# Patient Record
Sex: Female | Born: 1955 | Race: White | Hispanic: No | Marital: Married | Smoking: Never smoker
Health system: Southern US, Community
[De-identification: ages and names within clinical notes are randomized; demographics above are authoritative.]

## PROBLEM LIST (undated history)

## (undated) DIAGNOSIS — F419 Anxiety disorder, unspecified: Secondary | ICD-10-CM

## (undated) DIAGNOSIS — F32A Depression, unspecified: Secondary | ICD-10-CM

## (undated) DIAGNOSIS — F319 Bipolar disorder, unspecified: Secondary | ICD-10-CM

## (undated) DIAGNOSIS — F329 Major depressive disorder, single episode, unspecified: Secondary | ICD-10-CM

## (undated) HISTORY — DX: Depression, unspecified: F32.A

## (undated) HISTORY — PX: CHOLECYSTECTOMY: SHX55

## (undated) HISTORY — DX: Bipolar disorder, unspecified: F31.9

## (undated) HISTORY — PX: TONSILLECTOMY: SUR1361

## (undated) HISTORY — DX: Anxiety disorder, unspecified: F41.9

## (undated) HISTORY — DX: Major depressive disorder, single episode, unspecified: F32.9

## (undated) HISTORY — PX: OTHER SURGICAL HISTORY: SHX169

---

## 1998-07-07 ENCOUNTER — Encounter: Payer: Self-pay | Admitting: Obstetrics and Gynecology

## 1998-07-07 ENCOUNTER — Ambulatory Visit (HOSPITAL_COMMUNITY): Admission: RE | Admit: 1998-07-07 | Discharge: 1998-07-07 | Payer: Self-pay | Admitting: Obstetrics and Gynecology

## 1998-12-03 ENCOUNTER — Inpatient Hospital Stay (HOSPITAL_COMMUNITY): Admission: AD | Admit: 1998-12-03 | Discharge: 1998-12-06 | Payer: Self-pay | Admitting: Obstetrics and Gynecology

## 1998-12-07 ENCOUNTER — Encounter (HOSPITAL_COMMUNITY): Admission: RE | Admit: 1998-12-07 | Discharge: 1999-03-07 | Payer: Self-pay | Admitting: Obstetrics and Gynecology

## 1999-08-08 ENCOUNTER — Other Ambulatory Visit: Admission: RE | Admit: 1999-08-08 | Discharge: 1999-08-08 | Payer: Self-pay | Admitting: Obstetrics and Gynecology

## 1999-11-29 ENCOUNTER — Encounter: Admission: RE | Admit: 1999-11-29 | Discharge: 1999-11-29 | Payer: Self-pay | Admitting: Obstetrics and Gynecology

## 1999-11-29 ENCOUNTER — Encounter: Payer: Self-pay | Admitting: Obstetrics and Gynecology

## 2000-11-13 ENCOUNTER — Other Ambulatory Visit: Admission: RE | Admit: 2000-11-13 | Discharge: 2000-11-13 | Payer: Self-pay | Admitting: Obstetrics and Gynecology

## 2001-11-18 ENCOUNTER — Other Ambulatory Visit: Admission: RE | Admit: 2001-11-18 | Discharge: 2001-11-18 | Payer: Self-pay | Admitting: Obstetrics and Gynecology

## 2002-03-16 ENCOUNTER — Ambulatory Visit (HOSPITAL_COMMUNITY): Admission: RE | Admit: 2002-03-16 | Discharge: 2002-03-16 | Payer: Self-pay | Admitting: Obstetrics and Gynecology

## 2002-03-16 ENCOUNTER — Encounter: Payer: Self-pay | Admitting: Obstetrics and Gynecology

## 2003-01-22 ENCOUNTER — Other Ambulatory Visit: Admission: RE | Admit: 2003-01-22 | Discharge: 2003-01-22 | Payer: Self-pay | Admitting: Obstetrics and Gynecology

## 2003-10-16 ENCOUNTER — Emergency Department (HOSPITAL_COMMUNITY): Admission: EM | Admit: 2003-10-16 | Discharge: 2003-10-17 | Payer: Self-pay | Admitting: Emergency Medicine

## 2004-01-25 ENCOUNTER — Other Ambulatory Visit: Admission: RE | Admit: 2004-01-25 | Discharge: 2004-01-25 | Payer: Self-pay | Admitting: Obstetrics and Gynecology

## 2007-12-08 ENCOUNTER — Ambulatory Visit (HOSPITAL_COMMUNITY): Admission: RE | Admit: 2007-12-08 | Discharge: 2007-12-08 | Payer: Self-pay | Admitting: Obstetrics and Gynecology

## 2010-09-19 NOTE — Op Note (Signed)
NAMELINDEN, Jasmine Schmidt               ACCOUNT NO.:  [id number]   MEDICAL RECORD NO.:  [id number]          PATIENT TYPE:  AMB   LOCATION:  SDC                           FACILITY:  [facility]   PHYSICIAN:  Zenaida Niece, M.D.DATE OF BIRTH:  Jan 17, 1956   DATE OF PROCEDURE:  12/08/2007  DATE OF DISCHARGE:                               OPERATIVE REPORT   PREOPERATIVE DIAGNOSIS:  Stress urinary incontinence.   POSTOPERATIVE DIAGNOSIS:  Stress urinary incontinence.   PROCEDURES:  Solyx sling and cystoscopy.   SURGEON:  Zenaida Niece, MD   ANESTHESIA:  General with an LMA and local block.   FINDINGS:  She had a normal bladder and urethra via cystoscopy.   SPECIMENS:  None.   ESTIMATED BLOOD LOSS:  100 mL.   COMPLICATIONS:  None.   PROCEDURE IN DETAIL:  The patient was taken to the operating room and  placed in the dorsal supine position.  General anesthesia was induced  and she was placed in mobile stirrups.  Legs were then elevated in the  stirrups.  Perineum and vagina were then prepped and draped in the usual  sterile fashion and the bladder drained with a latex free catheter.  A  Deaver retractor was used just to retract the posterior vagina.  The  anterior vagina was grasped with Allis clamps at a distance  approximately a little further than one fingerbreadth from the urethral  meatus.  Local anesthetic with 1% lidocaine mixed with 0.5% Marcaine  with epinephrine was injected here and laterally for hydrodissection.  A  1-cm vertical incision was made.  Metzenbaum scissors were then used to  dissect out laterally to the pubic ramus on both sides.  The Solyx sling  was then easily inserted first on the patient's right side leaving the  middle of the sling in the middle of the urethra.  The introducer was  removed and placed on the left side, then left side was introduced.  Both sides were introduced past the pubic ramus into the obturator  muscle.  A right-angle clamp was passed  between the urethral tissue and  the sling.  At first, it appeared to be a little loose and so the sling  was pushed in a little bit further on the patient's left side.  Cystoscopy was then performed with a 70-degree cystoscope using sterile  water.  200 mL was instilled.  The bladder appeared normal and both  ureteral orifices appeared normal.  The urethra appeared normal.  The  cystoscope was removed.  With the sling in its current position, Cred  maneuver did not express any urine.  The introducer was removed on the  patient's left side.  The incision was closed with 2-0 Vicryl in a  running locking fashion.  The bladder was then drained of approximately  one-half of the fluid that was instilled.  No significant bleeding was  noted.  All instruments were removed from the  vagina.  The patient was taken down from stirrups.  She was awakened in  the operating room and taken to the recovery room in stable condition.  Counts were correct.  She received Ancef 1 g IV prior to the procedure  and had PAS hose on throughout the procedure.      Zenaida Niece, M.D.  Electronically Signed     TDM/MEDQ  D:  12/08/2007  T:  12/08/2007  Job:  [id number]

## 2011-02-02 LAB — CBC
HCT: 37.2
Hemoglobin: 12.9
MCHC: 34.7
MCV: 88.3
Platelets: 235
RBC: 4.21
RDW: 12.6
WBC: 5.4

## 2011-02-02 LAB — PREGNANCY, URINE: Preg Test, Ur: NEGATIVE

## 2014-07-13 ENCOUNTER — Ambulatory Visit (INDEPENDENT_AMBULATORY_CARE_PROVIDER_SITE_OTHER): Payer: Self-pay

## 2014-07-13 ENCOUNTER — Ambulatory Visit (INDEPENDENT_AMBULATORY_CARE_PROVIDER_SITE_OTHER): Payer: Self-pay | Admitting: Family Medicine

## 2014-07-13 VITALS — BP 120/84 | HR 106 | Temp 97.8°F | Resp 20 | Ht 67.25 in | Wt 215.4 lb

## 2014-07-13 DIAGNOSIS — R110 Nausea: Secondary | ICD-10-CM

## 2014-07-13 DIAGNOSIS — R42 Dizziness and giddiness: Secondary | ICD-10-CM

## 2014-07-13 DIAGNOSIS — A499 Bacterial infection, unspecified: Secondary | ICD-10-CM

## 2014-07-13 DIAGNOSIS — R232 Flushing: Secondary | ICD-10-CM

## 2014-07-13 DIAGNOSIS — N951 Menopausal and female climacteric states: Secondary | ICD-10-CM

## 2014-07-13 DIAGNOSIS — R319 Hematuria, unspecified: Secondary | ICD-10-CM

## 2014-07-13 DIAGNOSIS — Z79899 Other long term (current) drug therapy: Secondary | ICD-10-CM

## 2014-07-13 DIAGNOSIS — N390 Urinary tract infection, site not specified: Secondary | ICD-10-CM

## 2014-07-13 LAB — POCT UA - MICROSCOPIC ONLY
Bacteria, U Microscopic: NEGATIVE
Casts, Ur, LPF, POC: NEGATIVE
Crystals, Ur, HPF, POC: NEGATIVE
Mucus, UA: NEGATIVE
Yeast, UA: NEGATIVE

## 2014-07-13 LAB — POCT CBC
Granulocyte percent: 60.7 % (ref 37–80)
HCT, POC: 49.8 % — AB (ref 37.7–47.9)
Hemoglobin: 16.8 g/dL — AB (ref 12.2–16.2)
Lymph, poc: 2.7 (ref 0.6–3.4)
MCH, POC: 28.8 pg (ref 27–31.2)
MCHC: 33.7 g/dL (ref 31.8–35.4)
MCV: 85.4 fL (ref 80–97)
MID (cbc): 0.4 (ref 0–0.9)
MPV: 6.8 fL (ref 0–99.8)
POC Granulocyte: 4.9 (ref 2–6.9)
POC LYMPH PERCENT: 34.1 %L (ref 10–50)
POC MID %: 5.2 %M (ref 0–12)
Platelet Count, POC: 302 10*3/uL (ref 142–424)
RBC: 5.83 M/uL — AB (ref 4.04–5.48)
RDW, POC: 12.5 %
WBC: 8 10*3/uL (ref 4.6–10.2)

## 2014-07-13 LAB — POCT URINALYSIS DIPSTICK
Glucose, UA: NEGATIVE
Nitrite, UA: NEGATIVE
Protein, UA: 30
Spec Grav, UA: >=1.03
Urobilinogen, UA: 1
pH, UA: 5.5

## 2014-07-13 MED ORDER — CEPHALEXIN 500 MG PO CAPS: 500.0000 mg | ORAL_CAPSULE | Freq: Two times a day (BID) | ORAL | Status: DC

## 2014-07-13 MED ORDER — PROMETHAZINE HCL 25 MG PO TABS: 25.0000 mg | ORAL_TABLET | Freq: Three times a day (TID) | ORAL | Status: DC | PRN

## 2014-07-13 NOTE — Progress Notes (Signed)
Chief Complaint:  Chief Complaint  Patient presents with  . Dizziness    started on thursday night.  nausea with the dizziness.  feels very weak and lethargic.  diarrhea started today. chills     HPI: Jasmine Schmidt is a 59 y.o. female who is here for  Flu like sxs, which started on Thursday night. She also has associated nausea with disease. She also has had chills and diarrhea. Feels really spinning. To really eat or drink. Continues ginger ale. She also had some light and sound sensitivity. Now that has resolved. Riding scooter. She had hot flashes felt nauseated. She got a little bit better. Is on Vyvanse, Cymbalta and Lamictal. She got off of the Cymbalta on March 1. She started having symptoms about 3 days later. She thought she had enough Cymbalta last her for another month. However she did not. She asked the nurse practitioner closer at the Cymbalta to fill out the form for patient assistance that she cannot afford it. She has no insurance. Is not received word whether she has a new prescription at her office. Since she's been feeling so terrible she has been also offer Lamictal and Vyvanse. She has had nonbloody diarrhea. She has no urinary symptoms. She does not know what dose she is on but she takes it twice a day. She has not had a menstrual cycle since 2013. He has some hot flashes associated with being postmenopausal.  Past Medical History  Diagnosis Date  . Anxiety   . Depression   . Bipolar 1 disorder    Past Surgical History  Procedure Laterality Date  . Tonsillectomy    . Pelvic sling     History   Social History  . Marital Status: Married    Spouse Name: N/A  . Number of Children: N/A  . Years of Education: N/A   Social History Main Topics  . Smoking status: Never Smoker   . Smokeless tobacco: Not on file  . Alcohol Use: 0.0 oz/week    0 Standard drinks or equivalent per week     Comment: social use  . Drug Use: No  . Sexual Activity: Not on file    Other Topics Concern  . None   Social History Narrative  . None   Family History  Problem Relation Age of Onset  . Diabetes Mother   . Diabetes Father   . Heart disease Father   . Hypertension Father   . Stroke Father    No Known Allergies Prior to Admission medications   Not on File     ROS: The patient denies fevers, , night sweats, unintentional weight loss, chest pain, palpitations, wheezing, dyspnea on exertion, vomiting, abdominal pain, dysuria, hematuria, melena, numbness,  or tingling.   All other systems have been reviewed and were otherwise negative with the exception of those mentioned in the HPI and as above.    PHYSICAL EXAM: Filed Vitals:   07/13/14 1919  BP: 120/84  Pulse: 106  Temp: 97.8 F (36.6 C)  Resp: 20   Filed Vitals:   07/13/14 1919  Height: 5' 7.25" (1.708 m)  Weight: 215 lb 6 oz (97.693 kg)   Body mass index is 33.49 kg/(m^2).  General: Alert, no acute distress HEENT:  Normocephalic, atraumatic, oropharynx patent. EOMI, PERRLA , fundo exam normal. Cardiovascular:  Regular rate and rhythm, no rubs murmurs or gallops.  No Carotid bruits, radial pulse intact. No pedal edema.  Respiratory: Clear to auscultation  bilaterally.  No wheezes, rales, or rhonchi.  No cyanosis, no use of accessory musculature GI: No organomegaly, abdomen is soft and non-tender, positive bowel sounds.  No masses. Skin: No rashes. She is slightly flushed. Neurologic: Facial musculature symmetric. Cranial nerves II-12 grossly normal. Psychiatric: Patient is appropriate throughout our interaction. Lymphatic: No cervical lymphadenopathy Musculoskeletal: Gait intact. 5 out of 5 strength.   LABS: Results for orders placed or performed in visit on 07/13/14  POCT CBC  Result Value Ref Range   WBC 8.0 4.6 - 10.2 K/uL   Lymph, poc 2.7 0.6 - 3.4   POC LYMPH PERCENT 34.1 10 - 50 %L   MID (cbc) 0.4 0 - 0.9   POC MID % 5.2 0 - 12 %M   POC Granulocyte 4.9 2 - 6.9    Granulocyte percent 60.7 37 - 80 %G   RBC 5.83 (A) 4.04 - 5.48 M/uL   Hemoglobin 16.8 (A) 12.2 - 16.2 g/dL   HCT, POC 16.1 (A) 09.6 - 47.9 %   MCV 85.4 80 - 97 fL   MCH, POC 28.8 27 - 31.2 pg   MCHC 33.7 31.8 - 35.4 g/dL   RDW, POC 04.5 %   Platelet Count, POC 302 142 - 424 K/uL   MPV 6.8 0 - 99.8 fL  POCT UA - Microscopic Only  Result Value Ref Range   WBC, Ur, HPF, POC 2-3    RBC, urine, microscopic 4-8    Bacteria, U Microscopic neg    Mucus, UA neg    Epithelial cells, urine per micros 0-2    Crystals, Ur, HPF, POC neg    Casts, Ur, LPF, POC neg    Yeast, UA neg    Renal tubular cells 0-2   POCT urinalysis dipstick  Result Value Ref Range   Color, UA yellow    Clarity, UA clear    Glucose, UA neg    Bilirubin, UA small    Ketones, UA trace    Spec Grav, UA >=1.030    Blood, UA large    pH, UA 5.5    Protein, UA 30    Urobilinogen, UA 1.0    Nitrite, UA neg    Leukocytes, UA Trace      EKG/XRAY:   Primary read interpreted by Dr. [facility] at [facility]. Neg for obstruction   ASSESSMENT/PLAN: Encounter Diagnoses  Name Primary?  . Nausea without vomiting Yes  . Hot flashes   . Dizziness and giddiness   . Medication course changed   . UTI (urinary tract infection), bacterial   . Hematuria    Jasmine Schmidt is a pleasant 59 year old female with a past medical history bipolar disorder, anxiety and depression who is here symptoms of Cymbalta withdrawal.  She may also have a UTI. She ran out of her Cymbalta about 7 days ago and started having symptoms about 3 days later. I have asked her to call the nurse practitioner prescribes her Cymbalta to see if her free samples have arrived, unfortunately she cannot afford Cymbalta out-of-pocket. We do not have any samples available for her here. She states that she cannot afford Cymbalta even if she uses a discount card. I have given her significant withdrawal symptoms and side effects from abruptly stopping Cymbalta. Prescribed  promethazine and also Keflex for UTI. We will not Culture her urine due to cost. CMP, TSH pending. CBC was fairly unremarkable. Follow-up as needed.  Gross sideeffects, risk and benefits, and alternatives of medications d/w patient. Patient is  aware that all medications have potential sideeffects and we are unable to predict every sideeffect or drug-drug interaction that may occur.  Hamilton Capri PHUONG, DO 07/13/2014 8:55 PM

## 2014-07-13 NOTE — Patient Instructions (Signed)
Duloxetine delayed-release capsules What is this medicine? DULOXETINE (doo LOX e teen) is used to treat depression, anxiety, and different types of chronic pain. This medicine may be used for other purposes; ask your health care provider or pharmacist if you have questions. COMMON BRAND NAME(S): Cymbalta What should I tell my health care provider before I take this medicine? They need to know if you have any of these conditions: -bipolar disorder or a family history of bipolar disorder -glaucoma -kidney disease -liver disease -suicidal thoughts or a previous suicide attempt -taken medicines called MAOIs like Carbex, Eldepryl, Marplan, Nardil, and Parnate within 14 days -an unusual reaction to duloxetine, other medicines, foods, dyes, or preservatives -pregnant or trying to get pregnant -breast-feeding How should I use this medicine? Take this medicine by mouth with a glass of water. Follow the directions on the prescription label. Do not cut, crush or chew this medicine. You can take this medicine with or without food. Take your medicine at regular intervals. Do not take your medicine more often than directed. Do not stop taking this medicine suddenly except upon the advice of your doctor. Stopping this medicine too quickly may cause serious side effects or your condition may worsen. A special MedGuide will be given to you by the pharmacist with each prescription and refill. Be sure to read this information carefully each time. Talk to your pediatrician regarding the use of this medicine in children. While this drug may be prescribed for children as Limb as 7 years of age for selected conditions, precautions do apply. Overdosage: If you think you have taken too much of this medicine contact a poison control center or emergency room at once. NOTE: This medicine is only for you. Do not share this medicine with others. What if I miss a dose? If you miss a dose, take it as soon as you can. If it  is almost time for your next dose, take only that dose. Do not take double or extra doses. What may interact with this medicine? Do not take this medicine with any of the following medications: -certain diet drugs like dexfenfluramine, fenfluramine -desvenlafaxine -linezolid -MAOIs like Azilect, Carbex, Eldepryl, Marplan, Nardil, and Parnate -methylene blue (intravenous) -milnacipran -thioridazine -venlafaxine This medicine may also interact with the following medications: -alcohol -aspirin and aspirin-like medicines -certain antibiotics like ciprofloxacin and enoxacin -certain medicines for blood pressure, heart disease, irregular heart beat -certain medicines for depression, anxiety, or psychotic disturbances -certain medicines for migraine headache like almotriptan, eletriptan, frovatriptan, naratriptan, rizatriptan, sumatriptan, zolmitriptan -certain medicines that treat or prevent blood clots like warfarin, enoxaparin, and dalteparin -cimetidine -fentanyl -lithium -NSAIDS, medicines for pain and inflammation, like ibuprofen or naproxen -phentermine -procarbazine -sibutramine -St. John's wort -theophylline -tramadol -tryptophan This list may not describe all possible interactions. Give your health care provider a list of all the medicines, herbs, non-prescription drugs, or dietary supplements you use. Also tell them if you smoke, drink alcohol, or use illegal drugs. Some items may interact with your medicine. What should I watch for while using this medicine? Tell your doctor if your symptoms do not get better or if they get worse. Visit your doctor or health care professional for regular checks on your progress. Because it may take several weeks to see the full effects of this medicine, it is important to continue your treatment as prescribed by your doctor. Patients and their families should watch out for new or worsening thoughts of suicide or depression. Also watch out for  sudden changes in   feelings such as feeling anxious, agitated, panicky, irritable, hostile, aggressive, impulsive, severely restless, overly excited and hyperactive, or not being able to sleep. If this happens, especially at the beginning of treatment or after a change in dose, call your health care professional. You may get drowsy or dizzy. Do not drive, use machinery, or do anything that needs mental alertness until you know how this medicine affects you. Do not stand or sit up quickly, especially if you are an older patient. This reduces the risk of dizzy or fainting spells. Alcohol may interfere with the effect of this medicine. Avoid alcoholic drinks. This medicine can cause an increase in blood pressure. This medicine can also cause a sudden drop in your blood pressure, which may make you feel faint and increase the chance of a fall. These effects are most common when you first start the medicine or when the dose is increased, or during use of other medicines that can cause a sudden drop in blood pressure. Check with your doctor for instructions on monitoring your blood pressure while taking this medicine. Your mouth may get dry. Chewing sugarless gum or sucking hard candy, and drinking plenty of water may help. Contact your doctor if the problem does not go away or is severe. What side effects may I notice from receiving this medicine? Side effects that you should report to your doctor or health care professional as soon as possible: -allergic reactions like skin rash, itching or hives, swelling of the face, lips, or tongue -changes in blood pressure -confusion -dark urine -dizziness -fast talking and excited feelings or actions that are out of control -fast, irregular heartbeat -fever -general ill feeling or flu-like symptoms -hallucination, loss of contact with reality -light-colored stools -loss of balance or coordination -redness, blistering, peeling or loosening of the skin, including  inside the mouth -right upper belly pain -seizures -suicidal thoughts or other mood changes -trouble concentrating -trouble passing urine or change in the amount of urine -unusual bleeding or bruising -unusually weak or tired -yellowing of the eyes or skin Side effects that usually do not require medical attention (report to your doctor or health care professional if they continue or are bothersome): -blurred vision -change in appetite -change in sex drive or performance -headache -increased sweating -nausea This list may not describe all possible side effects. Call your doctor for medical advice about side effects. You may report side effects to FDA at 1-800-FDA-1088. Where should I keep my medicine? Keep out of the reach of children. Store at room temperature between 15 and 30 degrees C (59 and 86 degrees F). Throw away any unused medicine after the expiration date. NOTE: This sheet is a summary. It may not cover all possible information. If you have questions about this medicine, talk to your doctor, pharmacist, or health care provider.  2015, [employer]vier/[employer] Standard. (2013-04-14 14:20:31)  

## 2014-07-14 ENCOUNTER — Telehealth: Payer: Self-pay | Admitting: Family Medicine

## 2014-07-14 LAB — COMPLETE METABOLIC PANEL WITH GFR
ALT: 26 U/L (ref 0–35)
AST: 20 U/L (ref 0–37)
Albumin: 4.8 g/dL (ref 3.5–5.2)
Alkaline Phosphatase: 139 U/L — ABNORMAL HIGH (ref 39–117)
BUN: 13 mg/dL (ref 6–23)
CO2: 26 mEq/L (ref 19–32)
Chloride: 101 mEq/L (ref 96–112)
Creat: 0.86 mg/dL (ref 0.50–1.10)
GFR, Est Non African American: 76 mL/min
Sodium: 141 mEq/L (ref 135–145)
Total Bilirubin: 0.6 mg/dL (ref 0.2–1.2)

## 2014-07-14 LAB — COMPLETE METABOLIC PANEL WITHOUT GFR
Calcium: 10.7 mg/dL — ABNORMAL HIGH (ref 8.4–10.5)
GFR, Est African American: 87 mL/min
Glucose, Bld: 113 mg/dL — ABNORMAL HIGH (ref 70–99)
Potassium: 4.3 meq/L (ref 3.5–5.3)
Total Protein: 8.5 g/dL — ABNORMAL HIGH (ref 6.0–8.3)

## 2014-07-14 LAB — TSH: TSH: 3.305 u[IU]/mL (ref 0.350–4.500)

## 2014-07-14 NOTE — Telephone Encounter (Signed)
Spoke to patient about labs, needs to monitor for worsening sxs, she is in the midst of getting a refill for Cymbalta through [facility]

## 2014-07-21 ENCOUNTER — Ambulatory Visit (INDEPENDENT_AMBULATORY_CARE_PROVIDER_SITE_OTHER): Payer: Self-pay | Admitting: Family Medicine

## 2014-07-21 ENCOUNTER — Encounter: Payer: Self-pay | Admitting: Family Medicine

## 2014-07-21 VITALS — BP 132/84 | HR 102 | Resp 24

## 2014-07-21 DIAGNOSIS — T887XXA Unspecified adverse effect of drug or medicament, initial encounter: Secondary | ICD-10-CM

## 2014-07-21 DIAGNOSIS — F3162 Bipolar disorder, current episode mixed, moderate: Secondary | ICD-10-CM

## 2014-07-21 DIAGNOSIS — F410 Panic disorder [episodic paroxysmal anxiety] without agoraphobia: Secondary | ICD-10-CM | POA: Insufficient documentation

## 2014-07-21 DIAGNOSIS — T50905A Adverse effect of unspecified drugs, medicaments and biological substances, initial encounter: Secondary | ICD-10-CM

## 2014-07-21 DIAGNOSIS — R079 Chest pain, unspecified: Secondary | ICD-10-CM

## 2014-07-21 DIAGNOSIS — F411 Generalized anxiety disorder: Secondary | ICD-10-CM

## 2014-07-21 MED ORDER — ALPRAZOLAM 0.25 MG PO TABS
ORAL_TABLET | ORAL | Status: AC
Start: 2014-07-21 — End: ?

## 2014-07-21 MED ORDER — ALPRAZOLAM 0.25 MG PO TABS
0.2500 mg | ORAL_TABLET | Freq: Once | ORAL | Status: AC
  Administered 2014-07-21: 0.25 mg via ORAL

## 2014-07-21 NOTE — Progress Notes (Signed)
59 year old lady who has been having some medicines adjusted. She had run out of her Cymbalta, and is been trying to get some more. Dr. Conley RollsLe had advised her to resume her previous medicines which included the Lamictal. She had seen Dr. Nedra HaiLee last week with withdrawal symptoms from the Cymbalta. She decided to take the Lamictal today, and started back in on the 40 mg that she used to be on. Within a couple of hours she developed and society with shaking all over and feeling like her heart was racing and pounding. She came in here to be seen. A friend drove her here.  She has a long history of psychiatric issues, bipolar since she was [place]. She does not have money and does not have her regular psychiatrist. [facility]. However she has had some difficulties with the way some things of been handled also, and does not want to go back there. She prefers the way Dr. Conley RollsLe is handling her problems.  She is not having chest pain or tightness or shortness of breath. She was felt to emergency her chest which is calm down now. She just been nervous all over. She knows that she had a little bit of a panic attack type sensation.  Objective: Extremely anxious and tremulous. Fully alert and oriented. Throat clear. Neck supple without nodes.Chest is clear. Heart regular without murmurs gallops or arrhythmias. Mildly tachycardic. Abdomen soft without mass or tenderness. Cranial nerves II-12 grossly intact. Motor symmetrical. Rapid alternating movements normal. Finger to nose normal. Gait good except for her shakiness.  EKG normal  Assessment: Panic Tachycardia Extreme anxiety Probable adverse effect of starting back in on her previously regular dose of Lamictal Bipolar  Plan: Low back on the medical starting tomorrow, but go very slowly with it. Take only one half of a 200 mg pill daily initially, then graduated upward. See Dr. Nedra HaiLee back next  week  Given 0.25 of Xanax here in the office and a prescription for some more visit for short-term use only.

## 2014-07-21 NOTE — Patient Instructions (Signed)
Take the alprazolam one every 8-12 hours if needed for anxiety  If you have a panic attack try breathing in and out slowly of a paper bag.  Try to get a little exercise such as taking a walk to help relax you  Take the Lamictal only one half tablet daily for about 5 days, then one half tablet twice daily. Do not go above the 200 mg until you're seen Dr. Conley RollsLe back. Please see Dr. Conley RollsLe next week. I do recommend that you still try to find a psychiatrist that you can be seeing.

## 2016-12-03 IMAGING — CR DG ABDOMEN 1V
1 series · 1 of 1 positions shown · non-contrast
Comparison: None.

CLINICAL DATA: Nausea without vomiting. Epigastric pain. Diarrhea.

EXAM:
ABDOMEN - 1 VIEW

[AP]
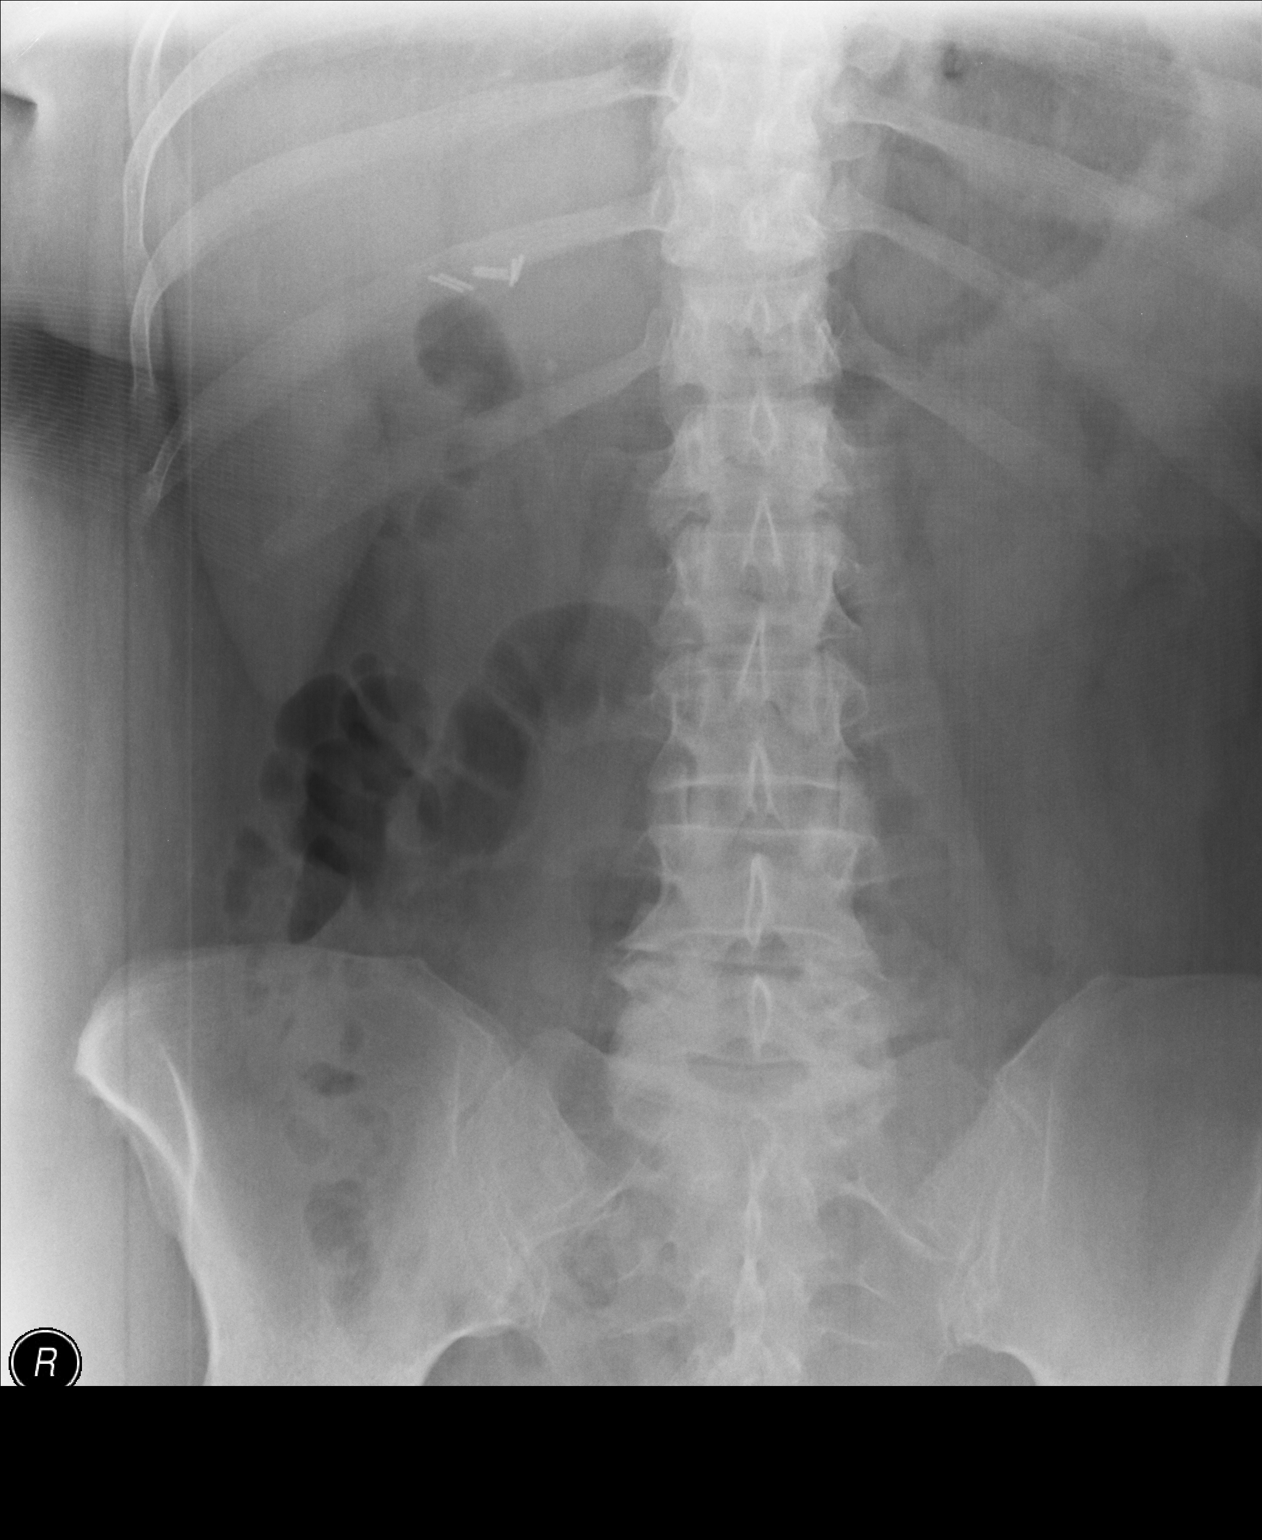

[1 of 1 positions shown; findings below may reference images not displayed]

FINDINGS: The bowel gas pattern is normal. No radio-opaque calculi or other
significant radiographic abnormality are seen.
IMPRESSION: Negative.

## 2022-07-17 ENCOUNTER — Emergency Department (HOSPITAL_BASED_OUTPATIENT_CLINIC_OR_DEPARTMENT_OTHER)
Admission: EM | Admit: 2022-07-17 | Discharge: 2022-07-17 | Disposition: A | Discharge status: Home / Self Care | Payer: Medicaid Other | Attending: Emergency Medicine | Admitting: Emergency Medicine

## 2022-07-17 ENCOUNTER — Encounter (HOSPITAL_BASED_OUTPATIENT_CLINIC_OR_DEPARTMENT_OTHER): Payer: Self-pay

## 2022-07-17 ENCOUNTER — Other Ambulatory Visit: Payer: Self-pay

## 2022-07-17 ENCOUNTER — Emergency Department (HOSPITAL_BASED_OUTPATIENT_CLINIC_OR_DEPARTMENT_OTHER): Payer: Medicaid Other | Admitting: Radiology

## 2022-07-17 DIAGNOSIS — Z1152 Encounter for screening for COVID-19: Secondary | ICD-10-CM | POA: Insufficient documentation

## 2022-07-17 DIAGNOSIS — J101 Influenza due to other identified influenza virus with other respiratory manifestations: Secondary | ICD-10-CM | POA: Diagnosis not present

## 2022-07-17 DIAGNOSIS — R197 Diarrhea, unspecified: Secondary | ICD-10-CM | POA: Diagnosis present

## 2022-07-17 DIAGNOSIS — J111 Influenza due to unidentified influenza virus with other respiratory manifestations: Secondary | ICD-10-CM

## 2022-07-17 LAB — COMPREHENSIVE METABOLIC PANEL
ALT: 33 U/L (ref 0–44)
AST: 25 U/L (ref 15–41)
Albumin: 4.8 g/dL (ref 3.5–5.0)
Alkaline Phosphatase: 97 U/L (ref 38–126)
Anion gap: 12 (ref 5–15)
BUN: 11 mg/dL (ref 8–23)
CO2: 27 mmol/L (ref 22–32)
Calcium: 10.1 mg/dL (ref 8.9–10.3)
Chloride: 101 mmol/L (ref 98–111)
Creatinine, Ser: 0.73 mg/dL (ref 0.44–1.00)
GFR, Estimated: >60 mL/min (ref >60)
Glucose, Bld: 114 mg/dL — ABNORMAL HIGH (ref 70–99)
Potassium: 3.2 mmol/L — ABNORMAL LOW (ref 3.5–5.1)
Sodium: 140 mmol/L (ref 135–145)
Total Bilirubin: 0.7 mg/dL (ref 0.3–1.2)
Total Protein: 8.3 g/dL — ABNORMAL HIGH (ref 6.5–8.1)

## 2022-07-17 LAB — GROUP A STREP BY PCR: Group A Strep by PCR: NOT DETECTED

## 2022-07-17 LAB — CBC
HCT: 46.2 % — ABNORMAL HIGH (ref 36.0–46.0)
Hemoglobin: 16 g/dL — ABNORMAL HIGH (ref 12.0–15.0)
MCH: 29.1 pg (ref 26.0–34.0)
MCHC: 34.6 g/dL (ref 30.0–36.0)
MCV: 84 fL (ref 80.0–100.0)
Platelets: 195 10*3/uL (ref 150–400)
RBC: 5.5 MIL/uL — ABNORMAL HIGH (ref 3.87–5.11)
RDW: 11.9 % (ref 11.5–15.5)
WBC: 3.7 10*3/uL — ABNORMAL LOW (ref 4.0–10.5)
nRBC: 0 % (ref 0.0–0.2)

## 2022-07-17 LAB — RESP PANEL BY RT-PCR (RSV, FLU A&B, COVID)  RVPGX2
Influenza A by PCR: POSITIVE — AB
Influenza B by PCR: NEGATIVE
Resp Syncytial Virus by PCR: NEGATIVE
SARS Coronavirus 2 by RT PCR: NEGATIVE

## 2022-07-17 LAB — LIPASE, BLOOD: Lipase: 57 U/L — ABNORMAL HIGH (ref 11–51)

## 2022-07-17 MED ORDER — LOPERAMIDE HCL 2 MG PO CAPS: 2.0000 mg | ORAL_CAPSULE | Freq: Four times a day (QID) | ORAL | 0 refills | Status: AC | PRN

## 2022-07-17 MED ORDER — ONDANSETRON 4 MG PO TBDP
4.0000 mg | ORAL_TABLET | Freq: Once | ORAL | Status: AC
  Administered 2022-07-17: 4 mg via ORAL
  Filled 2022-07-17: qty 1

## 2022-07-17 MED ORDER — ONDANSETRON 4 MG PO TBDP: 4.0000 mg | ORAL_TABLET | Freq: Three times a day (TID) | ORAL | 0 refills | Status: AC | PRN

## 2022-07-17 NOTE — ED Triage Notes (Signed)
Patient here POV from Home.  Endorses 8 days ago having Cough, Body Aches, Diarrhea, Nausea, Sore Throat.   No Emesis. No fever at Home.  NAD Noted during Triage. A&Ox4. GCS 15. BIB Wheelchair.

## 2022-07-17 NOTE — ED Notes (Signed)
Discharge instructions, follow up care, and prescription reviewed and explained, pt verbalized understanding and had no further questions on d/c.

## 2022-07-17 NOTE — ED Provider Notes (Signed)
[facility] Provider Note   CSN: [id number] Arrival date & time: 07/17/22  1536     History  Chief Complaint  Patient presents with   Diarrhea    Jasmine Schmidt is a 67 y.o. female with a past medical history of anxiety, bipolar 1 disorder presents today for evaluation of flulike symptoms.  Patient reports she started to have a cough 8 days ago followed by nausea, diarrhea, fatigue.  She denies having fever at home.  She endorses nausea without vomiting.  She also reports headache, being sensitive to light and sound.  She has tried Tylenol with no relief.  Unknown sick contacts.   Diarrhea   Past Medical History:  Diagnosis Date   Anxiety    Bipolar 1 disorder ([place])    Depression    Past Surgical History:  Procedure Laterality Date   pelvic sling     TONSILLECTOMY       Home Medications Prior to Admission medications   Medication Sig Start Date End Date Taking? Authorizing Provider  loperamide (IMODIUM) 2 MG capsule Take 1 capsule (2 mg total) by mouth 4 (four) times daily as needed for diarrhea or loose stools. 07/17/22  Yes Rex Kras, PA  ondansetron (ZOFRAN-ODT) 4 MG disintegrating tablet Take 1 tablet (4 mg total) by mouth every 8 (eight) hours as needed for nausea or vomiting. 07/17/22  Yes Rex Kras, PA  ALPRAZolam Duanne Moron) 0.25 MG tablet Take one every 8-12 hours only if needed for severe anxiety 07/21/14   Posey Boyer, MD  DULoxetine (CYMBALTA) 60 MG capsule Take 120 mg by mouth daily.    [provider]  lamoTRIgine (LAMICTAL) 200 MG tablet Take 200 mg by mouth daily.    [provider]  lisdexamfetamine (VYVANSE) 70 MG capsule Take 70 mg by mouth daily.    [provider]      Allergies    Patient has no known allergies.    Review of Systems   Review of Systems  Gastrointestinal:  Positive for diarrhea.    Physical Exam Updated Vital Signs BP 133/75 (BP Location: Left Arm)   Pulse 84    Temp 98 F (36.7 C)   Resp 20   Ht '5\' 8"'$  (1.727 m)   Wt 99.3 kg   SpO2 99%   BMI 33.30 kg/m  Physical Exam Vitals and nursing note reviewed.  Constitutional:      Appearance: Normal appearance.  HENT:     Head: Normocephalic and atraumatic.     Mouth/Throat:     Mouth: Mucous membranes are dry.  Eyes:     General: No scleral icterus. Cardiovascular:     Rate and Rhythm: Normal rate and regular rhythm.     Pulses: Normal pulses.     Heart sounds: Normal heart sounds.  Pulmonary:     Effort: Pulmonary effort is normal.     Breath sounds: Normal breath sounds.  Abdominal:     General: Abdomen is flat.     Palpations: Abdomen is soft.     Tenderness: There is no abdominal tenderness.  Musculoskeletal:        General: No deformity.  Skin:    General: Skin is warm.     Findings: No rash.  Neurological:     General: No focal deficit present.     Mental Status: She is alert.  Psychiatric:        Mood and Affect: Mood normal.  ED Results / Procedures / Treatments   Labs (all labs ordered are listed, but only abnormal results are displayed) Labs Reviewed  RESP PANEL BY RT-PCR (RSV, FLU A&B, COVID)  RVPGX2 - Abnormal; Notable for the following components:      Result Value   Influenza A by PCR POSITIVE (*)    All other components within normal limits  LIPASE, BLOOD - Abnormal; Notable for the following components:   Lipase 57 (*)    All other components within normal limits  COMPREHENSIVE METABOLIC PANEL - Abnormal; Notable for the following components:   Potassium 3.2 (*)    Glucose, Bld 114 (*)    Total Protein 8.3 (*)    All other components within normal limits  CBC - Abnormal; Notable for the following components:   WBC 3.7 (*)    RBC 5.50 (*)    Hemoglobin 16.0 (*)    HCT 46.2 (*)    All other components within normal limits  GROUP A STREP BY PCR    EKG None  Radiology DG Chest 2 View  Result Date: 07/17/2022 CLINICAL DATA:  Cough EXAM: CHEST -  2 VIEW COMPARISON:  None Available. FINDINGS: The heart size and mediastinal contours are within normal limits. Both lungs are clear. The visualized skeletal structures are unremarkable. IMPRESSION: No active cardiopulmonary disease. Electronically Signed   By: Ronney Asters M.D.   On: 07/17/2022 18:02    Procedures Procedures    Medications Ordered in ED Medications  ondansetron (ZOFRAN-ODT) disintegrating tablet 4 mg (4 mg Oral Given 07/17/22 1726)    ED Course/ Medical Decision Making/ A&P                             Medical Decision Making Amount and/or Complexity of Data Reviewed Labs: ordered. Radiology: ordered.  Risk Prescription drug management.   This patient presents to the ED for sore throat, body aches, this involves an extensive number of treatment options, and is a complaint that carries with a high risk of complications and morbidity.  The differential diagnosis includes flu, COVID, RSV, strep, pharyngitis, bronchitis, pneumonia, infectious etiology.  This is not an exhaustive list.  Lab tests: I ordered and personally interpreted labs.  The pertinent results include: Viral panel positive for flu.  Imaging studies:  Problem list/ ED course/ Critical interventions/ Medical management: HPI: See above Vital signs within normal range and stable throughout visit. Laboratory/imaging studies significant for: See above. On physical examination, patient is afebrile and appears in no acute distress. This patient presents with symptoms suspicious for flu. Based on history and physical doubt sinusitis. COVID test was negative. Do not suspect underlying cardiopulmonary process. I considered, but think unlikely, pneumonia. Patient is nontoxic appearing and not in need of emergent medical intervention. Patient told to self isolate at home until symptoms subside for 72 hours. Recommended patient to take TheraFlu or Mucinex for symptom relief.  Follow-up with primary care physician  for further evaluation and management.  Return to the ER if new or worsening symptoms. I have reviewed the patient home medicines and have made adjustments as needed.  Cardiac monitoring/EKG: The patient was maintained on a cardiac monitor.  I personally reviewed and interpreted the cardiac monitor which showed an underlying rhythm of: sinus rhythm.  Additional history obtained: External records from outside source obtained and reviewed including: Chart review including previous notes, labs, imaging.  Consultations obtained:  Disposition Continued outpatient therapy. Follow-up with  PCP recommended for reevaluation of symptoms. Treatment plan discussed with patient.  Pt acknowledged understanding was agreeable to the plan. Worrisome signs and symptoms were discussed with patient, and patient acknowledged understanding to return to the ED if they noticed these signs and symptoms. Patient was stable upon discharge.   This chart was dictated using voice recognition software.  Despite best efforts to proofread,  errors can occur which can change the documentation meaning.          Final Clinical Impression(s) / ED Diagnoses Final diagnoses:  Flu    Rx / DC Orders ED Discharge Orders          Ordered    loperamide (IMODIUM) 2 MG capsule  4 times daily PRN        07/17/22 1733    ondansetron (ZOFRAN-ODT) 4 MG disintegrating tablet  Every 8 hours PRN        07/17/22 1733              Rex Kras, PA 07/17/22 1821    Lacretia Leigh, MD 07/20/22 [phone]

## 2022-07-17 NOTE — Discharge Instructions (Addendum)
You tested positive for flu today.  Please take your medication as prescribed.  Stay hydrated, get some rest, practice social distancing. You can try TheraFlu/Mucinex/DayQuil/NyQuil for symptom relief.  Please take tylenol/ibuprofen for pain/fever. I recommend close follow-up with PCP for reevaluation.  Please do not hesitate to return to emergency department if worrisome signs symptoms we discussed become apparent.

## 2022-11-26 ENCOUNTER — Encounter: Payer: Self-pay | Admitting: *Deleted

## 2022-11-26 NOTE — Progress Notes (Signed)
Pt attended 10/27/2022 screening event where her b/p was 107/76. At the event, the pt identified her PCP as Dr. Ennis Forts at [facility] and did not note any SDOH insecurities. Chart review indicates pt was last seen by Dr. Shayne Alken on 09/20/2022 and visit notes planned [facility] management f/u as well as ongoing PCP care. Per chart review, pt also has ongoing cardiology care by Dr. Patrice Paradise at [facility]. No additional health equity team support indicated at this time.

## 2023-02-05 ENCOUNTER — Other Ambulatory Visit: Payer: Self-pay

## 2023-02-05 ENCOUNTER — Ambulatory Visit (INDEPENDENT_AMBULATORY_CARE_PROVIDER_SITE_OTHER): Payer: Medicaid Other | Admitting: Podiatry

## 2023-02-05 ENCOUNTER — Ambulatory Visit: Payer: Medicaid Other

## 2023-02-05 ENCOUNTER — Encounter: Payer: Self-pay | Admitting: Podiatry

## 2023-02-05 DIAGNOSIS — M79671 Pain in right foot: Secondary | ICD-10-CM

## 2023-02-05 DIAGNOSIS — M722 Plantar fascial fibromatosis: Secondary | ICD-10-CM

## 2023-02-05 MED ORDER — MELOXICAM 15 MG PO TABS: 15.0000 mg | ORAL_TABLET | Freq: Every day | ORAL | 3 refills | Status: AC

## 2023-02-05 MED ORDER — METHYLPREDNISOLONE 4 MG PO TBPK: ORAL_TABLET | ORAL | 0 refills | Status: AC

## 2023-02-05 NOTE — Patient Instructions (Signed)

## 2023-02-06 NOTE — Progress Notes (Signed)
  Subjective:  Patient ID: Jasmine Schmidt, female    DOB: 1955/09/29,  MRN: [id number]  Chief Complaint  Patient presents with   Plantar Fasciitis    Right heel pain off and on for a year, tried some home remedies    67 y.o. female presents with the above complaint. History confirmed with patient.   Objective:  Physical Exam: warm, good capillary refill, no trophic changes or ulcerative lesions, normal DP and PT pulses, and normal sensory exam. Left Foot: normal exam, no swelling, tenderness, instability; ligaments intact, full range of motion of all ankle/foot joints Right Foot: point tenderness over the heel pad  No images are attached to the encounter.  Radiographs: Multiple views x-ray of the right foot: no fracture, dislocation, swelling or degenerative changes noted, plantar calcaneal spur, and posterior calcaneal spur Assessment:   1. Pain of right heel   2. Plantar fasciitis of right foot      Plan:  Patient was evaluated and treated and all questions answered.  Discussed the etiology and treatment options for plantar fasciitis including stretching, formal physical therapy, supportive shoegears such as a running shoe or sneaker, pre fabricated orthoses, injection therapy, and oral medications. We also discussed the role of surgical treatment of this for patients who do not improve after exhausting non-surgical treatment options.   -XR reviewed with patient -Educated patient on stretching and icing of the affected limb -Rx for meloxicam. Educated on use, risks and benefits of the medication -Rx for medrol pack. Educated on use, risks, and benefits of the medication -Discussed injection therapy she declined this and would prefer to do the taper and Mobic first -Referral to PT sent to [facility] physical therapy on [address]  Return in about 6 weeks (around 03/19/2023) for recheck plantar fasciitis.

## 2023-03-07 ENCOUNTER — Ambulatory Visit: Payer: Medicaid Other | Admitting: Physical Therapy

## 2023-03-19 ENCOUNTER — Ambulatory Visit: Payer: Medicaid Other | Admitting: Podiatry

## 2023-03-20 ENCOUNTER — Ambulatory Visit: Payer: Medicaid Other

## 2023-03-28 ENCOUNTER — Other Ambulatory Visit: Payer: Self-pay

## 2023-03-28 ENCOUNTER — Ambulatory Visit: Payer: Medicaid Other | Attending: Podiatry

## 2023-03-28 DIAGNOSIS — M79671 Pain in right foot: Secondary | ICD-10-CM | POA: Insufficient documentation

## 2023-03-28 DIAGNOSIS — M6281 Muscle weakness (generalized): Secondary | ICD-10-CM | POA: Diagnosis present

## 2023-03-28 DIAGNOSIS — M722 Plantar fascial fibromatosis: Secondary | ICD-10-CM | POA: Insufficient documentation

## 2023-03-28 NOTE — Therapy (Signed)
OUTPATIENT PHYSICAL THERAPY LOWER EXTREMITY EVALUATION   Patient Name: Jasmine Schmidt MRN: [id number] DOB:10-Oct-1955, 67 y.o., female Today's Date: 03/29/2023  END OF SESSION:  PT End of Session - 03/29/23 0756     Visit Number 1    Number of Visits 16    Date for PT Re-Evaluation 05/24/23    Authorization Type [place] [employer] MCD    PT Start Time 1448    PT Stop Time 1530    PT Time Calculation (min) 42 min    Activity Tolerance Patient tolerated treatment well    Behavior During Therapy WFL for tasks assessed/performed             Past Medical History:  Diagnosis Date   Anxiety    Bipolar 1 disorder ([facility])    Depression    Past Surgical History:  Procedure Laterality Date   pelvic sling     TONSILLECTOMY     Patient Active Problem List   Diagnosis Date Noted   Panic anxiety syndrome 07/21/2014    PCP: Ennis Forts, MD   REFERRING PROVIDER: Edwin Cap, DPM   REFERRING DIAG: M72.2 (ICD-10-CM) - Plantar fasciitis of right foot   THERAPY DIAG:  Pain in right foot - Plan: PT plan of care cert/re-cert  Muscle weakness (generalized) - Plan: PT plan of care cert/re-cert  Rationale for Evaluation and Treatment: Rehabilitation  ONSET DATE: Chronic  SUBJECTIVE:   SUBJECTIVE STATEMENT: Pt presents to PT with reports of R heel pain over last few months. Pain is worst in morning but also increases with prolonged standing and walking. Denies N/T in R foot, is looking for more comfortable foot wear for decreasing pain.   PERTINENT HISTORY: Depression, Bipolar disorder  PAIN:  Are you having pain?  Yes: NPRS scale: 2/10 Worst: 10/10 Pain location: R heel, R foot Pain description: sharp, throbbing Aggravating factors: mornings, prolonged walking Relieving factors: medication  PRECAUTIONS: None  RED FLAGS: None   WEIGHT BEARING RESTRICTIONS: No  FALLS:  Has patient fallen in last 6 months? 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  DIAGNOSTIC FINDINGS:  See imaging  PATIENT SURVEYS:  FOTO: 43% function; 58% predicted  COGNITION: Overall cognitive status: Within functional limits for tasks assessed     SENSATION: WFL  POSTURE: rounded shoulders and forward head  PALPATION: TTP to R gastroc and R heel  LOWER EXTREMITY ROM:  Active ROM Right eval Left eval  Hip flexion    Hip extension    Hip abduction    Hip adduction    Hip internal rotation    Hip external rotation    Knee flexion    Knee extension    Ankle dorsiflexion 4 5  Ankle plantarflexion    Ankle inversion    Ankle eversion     (Blank rows = not tested)  LOWER EXTREMITY MMT:  MMT Right eval Left eval  Hip flexion    Hip extension    Hip abduction    Hip adduction    Hip internal rotation    Hip external rotation    Knee flexion    Knee extension    Ankle dorsiflexion 5 5  Ankle plantarflexion  Ankle inversion 4 5  Ankle eversion 5 5   (Blank rows = not tested)  LOWER EXTREMITY SPECIAL TESTS:  Ankle special tests: Thompson's test: negative  FUNCTIONAL TESTS:  30 Second Sit to Stand: 13 reps  GAIT: Distance walked: 48ft Assistive device utilized: None Level of assistance: Complete Independence Comments: antalgic gait on R, decreased heel strike R   TREATMENT: [facility] Adult PT Treatment:                                                DATE: 03/27/2020 Therapeutic Exercise: Fig 4 inversion x 10 R RTB Standing gastroc stretch x 30" R Standing soleus stretch x 30" R Seated plantar fascia ball stretch x 30"  PATIENT EDUCATION:  Education details: eval findings, FOTO, HEP, POC Person educated:  Patient Education method: Explanation, Demonstration, and Handouts Education comprehension: verbalized understanding and returned demonstration  HOME EXERCISE PROGRAM: Access Code: [email or web address] URL: [email or web address] Date: 03/28/2023 Prepared by: Edwinna Areola  Exercises - Seated Figure 4 Ankle Inversion with Resistance  - 1 x daily - 7 x weekly - 3 sets - 10 reps - red band hold - Gastroc Stretch on Wall  - 1 x daily - 7 x weekly - 2-3 reps - 30 sec hold - Soleus Stretch on Wall  - 1 x daily - 7 x weekly - 3 reps - 30 sec hold - Seated Plantar Fascia Mobilization with Small Ball  - 1 x daily - 7 x weekly - 2 reps - 2 min hold  ASSESSMENT:  CLINICAL IMPRESSION: Patient is a 67 y.o. F who was seen today for physical therapy evaluation and treatment for chronic R heel pain secondary to plantar fascitis. Physical findings are consistent with referring provider impression as pt demonstrates decrease in functional mobility and calf tightness. FOTO score show demonstrates decrease in subjective functional ability below PLOF. Pt would benefit from skilled PT services working on improving calf length and decreasing pain.    OBJECTIVE IMPAIRMENTS: Abnormal gait, decreased activity tolerance, decreased balance, decreased endurance, decreased mobility, difficulty walking, decreased ROM, decreased strength, and pain.   ACTIVITY LIMITATIONS: standing, squatting, stairs, transfers, and locomotion level  PARTICIPATION LIMITATIONS: driving, shopping, community activity, occupation, and yard work  PERSONAL FACTORS: 1-2 comorbidities: Depression, Bipolar disorder  are also affecting patient's functional outcome.   REHAB POTENTIAL: Excellent  CLINICAL DECISION MAKING: Stable/uncomplicated  EVALUATION COMPLEXITY: Low   GOALS: Goals reviewed with patient? No  SHORT TERM GOALS: Target date: 04/18/2023   Pt will be compliant and knowledgeable with initial HEP for improved comfort and  carryover Baseline: initial HEP given  Goal status: INITIAL  2.  Pt will self report right foot pain no greater than 7/10 for improved comfort and functional ability Baseline: 10/10 at worst Goal status: INITIAL   LONG TERM GOALS: Target date: 05/24/2023  Pt will improve FOTO function score to no less than 58% as proxy for functional improvement Baseline: 43% function Goal status: INITIAL   2.  Pt will self report right foot pain no greater than 3/10 for improved comfort and functional ability Baseline: 10/10 at worst Goal status: INITIAL   3.  Pt will improve bilateral ankle DF to no less than 10 degrees for improving comfort, gait, and mobility Baseline: see ROM chart Goal status: INITIAL  4.  Pt will be able to get back to walking  and exercising not limited by pain for improved comfort with desired recreational activity Baseline: unable Goal status: INITIAL   PLAN:  PT FREQUENCY: 1-2x/week  PT DURATION: 8 weeks  PLANNED INTERVENTIONS: 97164- PT Re-evaluation, 97110-Therapeutic exercises, 97530- Therapeutic activity, 97112- Neuromuscular re-education, 97535- Self Care, 16109- Manual therapy, [id number]- Gait training, [id number]- Aquatic Therapy, 97014- Electrical stimulation (unattended), [id number]- Electrical stimulation (manual), 97016- Vasopneumatic device, Cryotherapy, and Moist heat  PLAN FOR NEXT SESSION: assess HEP response, calf stretching, ankle strengthening, manual to R calf   Eloy End PT  03/29/23 8:07 AM

## 2023-04-16 ENCOUNTER — Ambulatory Visit: Payer: Medicaid Other | Admitting: Podiatry

## 2023-04-23 ENCOUNTER — Encounter: Payer: Medicaid Other | Admitting: Physical Therapy

## 2023-04-25 ENCOUNTER — Encounter: Payer: Self-pay | Admitting: Physical Therapy

## 2023-04-25 ENCOUNTER — Ambulatory Visit: Payer: Medicaid Other | Attending: Podiatry | Admitting: Physical Therapy

## 2023-04-25 DIAGNOSIS — M6281 Muscle weakness (generalized): Secondary | ICD-10-CM | POA: Insufficient documentation

## 2023-04-25 DIAGNOSIS — M79671 Pain in right foot: Secondary | ICD-10-CM | POA: Diagnosis present

## 2023-04-25 NOTE — Therapy (Signed)
OUTPATIENT PHYSICAL THERAPY LOWER EXTREMITY TREATMENT   Patient Name: Jasmine Schmidt MRN: [id number] DOB:1955/08/02, 67 y.o., female Today's Date: 04/25/2023  END OF SESSION:  PT End of Session - 04/25/23 1450     Visit Number 2    Number of Visits 16    Date for PT Re-Evaluation 05/24/23    Authorization Type [place] [employer] MCD    PT Start Time 0250    PT Stop Time 0330    PT Time Calculation (min) 40 min             Past Medical History:  Diagnosis Date   Anxiety    Bipolar 1 disorder (HCC)    Depression    Past Surgical History:  Procedure Laterality Date   pelvic sling     TONSILLECTOMY     Patient Active Problem List   Diagnosis Date Noted   Panic anxiety syndrome 07/21/2014    PCP: Ennis Forts, MD   REFERRING PROVIDER: Edwin Cap, DPM   REFERRING DIAG: M72.2 (ICD-10-CM) - Plantar fasciitis of right foot   THERAPY DIAG:  Muscle weakness (generalized)  Pain in right foot  Rationale for Evaluation and Treatment: Rehabilitation  ONSET DATE: Chronic  SUBJECTIVE:   SUBJECTIVE STATEMENT: If I dont take the meloxicam it hurts pretty bad. I did a few of the stretches but I was out of town.   EVAL: Pt presents to PT with reports of R heel pain over last few months. Pain is worst in morning but also increases with prolonged standing and walking. Denies N/T in R foot, is looking for more comfortable foot wear for decreasing pain.   PERTINENT HISTORY: Depression, Bipolar disorder  PAIN:  Are you having pain?  Yes: NPRS scale: 4/10 Worst: 10/10 Pain location: R heel, R foot Pain description: sharp, throbbing Aggravating factors: mornings, prolonged walking Relieving factors: medication  PRECAUTIONS: None  RED FLAGS: None   WEIGHT BEARING RESTRICTIONS: No  FALLS:  Has patient fallen in last 6 months? 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  DIAGNOSTIC FINDINGS:  See imaging  PATIENT SURVEYS:  FOTO: 43% function; 58% predicted  COGNITION: Overall cognitive status: Within functional limits for tasks assessed     SENSATION: WFL  POSTURE: rounded shoulders and forward head  PALPATION: TTP to R gastroc and R heel  LOWER EXTREMITY ROM:  Active ROM Right eval Left eval  Hip flexion    Hip extension    Hip abduction    Hip adduction    Hip internal rotation    Hip external rotation    Knee flexion    Knee extension    Ankle dorsiflexion 4 5  Ankle plantarflexion    Ankle inversion    Ankle eversion     (Blank rows = not tested)  LOWER EXTREMITY MMT:  MMT Right eval Left eval  Hip flexion    Hip extension    Hip abduction    Hip adduction    Hip internal rotation    Hip external rotation    Knee flexion    Knee extension    Ankle dorsiflexion 5 5  Ankle plantarflexion    Ankle inversion 4 5  Ankle eversion 5 5   (Blank rows = not tested)  LOWER EXTREMITY SPECIAL TESTS:  Ankle special tests: Thompson's test: negative  FUNCTIONAL TESTS:  30 Second Sit to Stand: 13 reps  GAIT: Distance walked: 44ft Assistive device utilized: None Level of assistance: Complete Independence Comments: antalgic gait on R, decreased heel strike R   TREATMENT: [facility] Adult PT Treatment:                                                DATE: 04/25/23 Therapeutic Exercise: Rec bike L1 x 5 min Slant board stretch Soleus stretch Bilat heel raises, cues for neutral ankle alignment  Seated toe scrunches  Toe Yoga  Seated Plantar fasica stretch  Seated and standing arch lifts  Seated red band IR 10 x 2  Seated vs sidelying clam with green band  Manual Therapy: Self strumming of  plantar fascia  Neuromuscular re-ed: Tandem stance  on and off  AIREX Tandem forward gait   [facility] Adult PT Treatment:                                                DATE: 03/27/2020 Therapeutic Exercise: Fig 4 inversion x 10 R RTB Standing gastroc stretch x 30" R Standing soleus stretch x 30" R Seated plantar fascia ball stretch x 30"  PATIENT EDUCATION:  Education details: eval findings, FOTO, HEP, POC Person educated: Patient Education method: Explanation, Demonstration, and Handouts Education comprehension: verbalized understanding and returned demonstration  HOME EXERCISE PROGRAM: Access Code: [email or web address] URL: [email or web address] Date: 03/28/2023 Prepared by: Edwinna Areola  Exercises - Seated Figure 4 Ankle Inversion with Resistance  - 1 x daily - 7 x weekly - 3 sets - 10 reps - red band hold - Gastroc Stretch on Wall  - 1 x daily - 7 x weekly - 2-3 reps - 30 sec hold - Soleus Stretch on Wall  - 1 x daily - 7 x weekly - 3 reps - 30 sec hold - Seated Plantar Fascia Mobilization with Small Ball  - 1 x daily - 7 x weekly - 2 reps - 2 min hold  ASSESSMENT:  CLINICAL IMPRESSION: Pt reports no major change in her condition. Has been out of town and minimally compliant with HEP. Reviewed HEP and progressed with ankle stability focus. Instructed in self Plantar fascia strumming with fist. Visually, during gastroc and soleus stretches, she is not demonstrating limited DF however he is challenged with stability on unlevel surface. Discussed potential for taping at a future session to support arch. Pt very interested in facility Airex pads and slant board to purchase for home. No complaints offered at end of session. Her HEP was updated.    EVAL: Patient is a 67 y.o. F who was seen today for physical therapy evaluation and treatment for chronic R heel pain secondary to plantar fascitis. Physical findings are consistent with referring provider impression as pt demonstrates  decrease in functional mobility and calf tightness. FOTO score show demonstrates decrease in subjective functional ability below PLOF. Pt would benefit from skilled PT services working on improving calf length and decreasing pain.    OBJECTIVE IMPAIRMENTS: Abnormal gait, decreased activity tolerance, decreased balance, decreased endurance, decreased mobility, difficulty walking, decreased  ROM, decreased strength, and pain.   ACTIVITY LIMITATIONS: standing, squatting, stairs, transfers, and locomotion level  PARTICIPATION LIMITATIONS: driving, shopping, community activity, occupation, and yard work  PERSONAL FACTORS: 1-2 comorbidities: Depression, Bipolar disorder  are also affecting patient's functional outcome.   REHAB POTENTIAL: Excellent  CLINICAL DECISION MAKING: Stable/uncomplicated  EVALUATION COMPLEXITY: Low   GOALS: Goals reviewed with patient? No  SHORT TERM GOALS: Target date: 04/18/2023   Pt will be compliant and knowledgeable with initial HEP for improved comfort and carryover Baseline: initial HEP given  Goal status: INITIAL  2.  Pt will self report right foot pain no greater than 7/10 for improved comfort and functional ability Baseline: 10/10 at worst  Goal status: INITIAL   LONG TERM GOALS: Target date: 05/24/2023  Pt will improve FOTO function score to no less than 58% as proxy for functional improvement Baseline: 43% function Goal status: INITIAL   2.  Pt will self report right foot pain no greater than 3/10 for improved comfort and functional ability Baseline: 10/10 at worst Goal status: INITIAL   3.  Pt will improve bilateral ankle DF to no less than 10 degrees for improving comfort, gait, and mobility Baseline: see ROM chart Goal status: INITIAL  4.  Pt will be able to get back to walking and exercising not limited by pain for improved comfort with desired recreational activity Baseline: unable Goal status: INITIAL   PLAN:  PT FREQUENCY:  1-2x/week  PT DURATION: 8 weeks  PLANNED INTERVENTIONS: 97164- PT Re-evaluation, 97110-Therapeutic exercises, 97530- Therapeutic activity, 97112- Neuromuscular re-education, 97535- Self Care, 16109- Manual therapy, [id number]- Gait training, [id number]- Aquatic Therapy, 97014- Electrical stimulation (unattended), [id number]- Electrical stimulation (manual), 97016- Vasopneumatic device, Cryotherapy, and Moist heat  PLAN FOR NEXT SESSION: assess HEP response, calf stretching, ankle strengthening, manual to R calf   Jannette Spanner, PTA 04/25/23 3:51 PM Phone: [phone] Fax: [phone]

## 2023-04-29 ENCOUNTER — Ambulatory Visit: Payer: Medicaid Other

## 2023-04-29 DIAGNOSIS — M79671 Pain in right foot: Secondary | ICD-10-CM

## 2023-04-29 DIAGNOSIS — M6281 Muscle weakness (generalized): Secondary | ICD-10-CM

## 2023-04-29 NOTE — Therapy (Signed)
OUTPATIENT PHYSICAL THERAPY LOWER EXTREMITY TREATMENT   Patient Name: Jasmine Schmidt MRN: [id number] DOB:1955-09-08, 67 y.o., female Today's Date: 04/29/2023  END OF SESSION:  PT End of Session - 04/29/23 1446     Visit Number 3    Number of Visits 16    Date for PT Re-Evaluation 05/24/23    Authorization Type [place] [employer] MCD    PT Start Time 1446    PT Stop Time 1530    PT Time Calculation (min) 44 min              Past Medical History:  Diagnosis Date   Anxiety    Bipolar 1 disorder (HCC)    Depression    Past Surgical History:  Procedure Laterality Date   pelvic sling     TONSILLECTOMY     Patient Active Problem List   Diagnosis Date Noted   Panic anxiety syndrome 07/21/2014    PCP: Ennis Forts, MD   REFERRING PROVIDER: Edwin Cap, DPM   REFERRING DIAG: M72.2 (ICD-10-CM) - Plantar fasciitis of right foot   THERAPY DIAG:  Muscle weakness (generalized)  Pain in right foot  Rationale for Evaluation and Treatment: Rehabilitation  ONSET DATE: Chronic  SUBJECTIVE:   SUBJECTIVE STATEMENT: Pt presents to PT with reports of no current pain or discomfort. Has been compliant with HEP.   EVAL: Pt presents to PT with reports of R heel pain over last few months. Pain is worst in morning but also increases with prolonged standing and walking. Denies N/T in R foot, is looking for more comfortable foot wear for decreasing pain.   PERTINENT HISTORY: Depression, Bipolar disorder  PAIN:  Are you having pain?  Yes: NPRS scale: 1/10 Worst: 10/10 Pain location: R heel, R foot Pain description: sharp, throbbing Aggravating factors: mornings, prolonged walking Relieving factors: medication  PRECAUTIONS: None  RED FLAGS: None   WEIGHT BEARING RESTRICTIONS: No  FALLS:  Has patient fallen in last 6 months? 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  DIAGNOSTIC FINDINGS:  See imaging  PATIENT SURVEYS:  FOTO: 43% function; 58% predicted  COGNITION: Overall cognitive status: Within functional limits for tasks assessed     SENSATION: WFL  POSTURE: rounded shoulders and forward head  PALPATION: TTP to R gastroc and R heel  LOWER EXTREMITY ROM:  Active ROM Right eval Left eval  Hip flexion    Hip extension    Hip abduction    Hip adduction    Hip internal rotation    Hip external rotation    Knee flexion    Knee extension    Ankle dorsiflexion 4 5  Ankle plantarflexion    Ankle inversion    Ankle eversion     (Blank rows = not tested)  LOWER EXTREMITY MMT:  MMT Right eval Left eval  Hip flexion    Hip extension    Hip abduction    Hip adduction    Hip internal rotation    Hip external rotation    Knee flexion    Knee extension    Ankle dorsiflexion 5 5  Ankle plantarflexion    Ankle inversion 4 5  Ankle eversion 5 5   (  Blank rows = not tested)  LOWER EXTREMITY SPECIAL TESTS:  Ankle special tests: Thompson's test: negative  FUNCTIONAL TESTS:  30 Second Sit to Stand: 13 reps  GAIT: Distance walked: 81ft Assistive device utilized: None Level of assistance: Complete Independence Comments: antalgic gait on R, decreased heel strike R   TREATMENT: [facility] Adult PT Treatment:                                                DATE: 04/29/23 Therapeutic Exercise: Rec bike L3 x 3 min Slant board stretch 2x30" Eccentric heel raises 2in 2x10 Soleus stretch x 30" R Heel raise with ball 2x10 Tandem stance on foam x 30" Bilateral ankle DF/inv/ev 2x15 RTB Manual Therapy: IASTM to R plantar fasica in L sidelying  [facility] Adult PT Treatment:                                                 DATE: 04/25/23 Therapeutic Exercise: Rec bike L1 x 5 min Slant board stretch Soleus stretch Bilat heel raises, cues for neutral ankle alignment  Seated toe scrunches  Toe Yoga  Seated Plantar fasica stretch  Seated and standing arch lifts  Seated red band IR 10 x 2  Seated vs sidelying clam with green band  Manual Therapy: Self strumming of plantar fascia  Neuromuscular re-ed: Tandem stance  on and off  AIREX Tandem forward gait   [facility] Adult PT Treatment:                                                DATE: 03/27/2020 Therapeutic Exercise: Fig 4 inversion x 10 R RTB Standing gastroc stretch x 30" R Standing soleus stretch x 30" R Seated plantar fascia ball stretch x 30"  PATIENT EDUCATION:  Education details: eval findings, FOTO, HEP, POC Person educated: Patient Education method: Explanation, Demonstration, and Handouts Education comprehension: verbalized understanding and returned demonstration  HOME EXERCISE PROGRAM: Access Code: [email or web address] URL: [email or web address] Date: 03/28/2023 Prepared by: Edwinna Areola  Exercises - Seated Figure 4 Ankle Inversion with Resistance  - 1 x daily - 7 x weekly - 3 sets - 10 reps - red band hold - Gastroc Stretch on Wall  - 1 x daily - 7 x weekly - 2-3 reps - 30 sec hold - Soleus Stretch on Wall  - 1 x daily - 7 x weekly - 3 reps - 30 sec hold - Seated Plantar Fascia Mobilization with Small Ball  - 1 x daily - 7 x weekly - 2 reps - 2 min hold  ASSESSMENT:  CLINICAL IMPRESSION: Pt was able to complete all prescribed exercises with no adverse effect. Therapy today focused on improving calf length and ankle strengthening. Noted decrease in pain post manual therapy. Will continue to progress exercises as able per POC.   EVAL: Patient is a 67 y.o. F who was seen today for physical therapy evaluation and treatment for chronic R heel pain secondary to plantar fascitis. Physical findings are consistent with referring  provider impression as pt demonstrates decrease in functional mobility and calf tightness. FOTO score show  demonstrates decrease in subjective functional ability below PLOF. Pt would benefit from skilled PT services working on improving calf length and decreasing pain.    OBJECTIVE IMPAIRMENTS: Abnormal gait, decreased activity tolerance, decreased balance, decreased endurance, decreased mobility, difficulty walking, decreased ROM, decreased strength, and pain.   ACTIVITY LIMITATIONS: standing, squatting, stairs, transfers, and locomotion level  PARTICIPATION LIMITATIONS: driving, shopping, community activity, occupation, and yard work  PERSONAL FACTORS: 1-2 comorbidities: Depression, Bipolar disorder  are also affecting patient's functional outcome.   REHAB POTENTIAL: Excellent  CLINICAL DECISION MAKING: Stable/uncomplicated  EVALUATION COMPLEXITY: Low   GOALS: Goals reviewed with patient? No  SHORT TERM GOALS: Target date: 04/18/2023   Pt will be compliant and knowledgeable with initial HEP for improved comfort and carryover Baseline: initial HEP given  Goal status: INITIAL  2.  Pt will self report right foot pain no greater than 7/10 for improved comfort and functional ability Baseline: 10/10 at worst  Goal status: INITIAL   LONG TERM GOALS: Target date: 05/24/2023  Pt will improve FOTO function score to no less than 58% as proxy for functional improvement Baseline: 43% function Goal status: INITIAL   2.  Pt will self report right foot pain no greater than 3/10 for improved comfort and functional ability Baseline: 10/10 at worst Goal status: INITIAL   3.  Pt will improve bilateral ankle DF to no less than 10 degrees for improving comfort, gait, and mobility Baseline: see ROM chart Goal status: INITIAL  4.  Pt will be able to get back to walking and exercising not limited by pain for improved comfort with desired recreational activity Baseline: unable Goal status:  INITIAL   PLAN:  PT FREQUENCY: 1-2x/week  PT DURATION: 8 weeks  PLANNED INTERVENTIONS: 97164- PT Re-evaluation, 97110-Therapeutic exercises, 97530- Therapeutic activity, 97112- Neuromuscular re-education, 97535- Self Care, 69629- Manual therapy, [id number]- Gait training, [id number]- Aquatic Therapy, 97014- Electrical stimulation (unattended), [id number]- Electrical stimulation (manual), 97016- Vasopneumatic device, Cryotherapy, and Moist heat  PLAN FOR NEXT SESSION: assess HEP response, calf stretching, ankle strengthening, manual to R calf   Eloy End PT  04/29/23 3:42 PM

## 2023-05-06 ENCOUNTER — Ambulatory Visit: Payer: Medicaid Other

## 2023-05-09 ENCOUNTER — Ambulatory Visit: Payer: Medicare Other | Attending: Podiatry

## 2023-05-09 DIAGNOSIS — M6281 Muscle weakness (generalized): Secondary | ICD-10-CM | POA: Diagnosis present

## 2023-05-09 DIAGNOSIS — M79671 Pain in right foot: Secondary | ICD-10-CM | POA: Insufficient documentation

## 2023-05-09 NOTE — Therapy (Addendum)
 OUTPATIENT PHYSICAL THERAPY LOWER EXTREMITY TREATMENT   Patient Name: Jasmine Schmidt MRN: [id number] DOB:01/25/1956, 69 y.o., female Today's Date: 05/09/2023  END OF SESSION:  PT End of Session - 05/09/23 1535     Visit Number 4    Number of Visits 16    Date for PT Re-Evaluation 05/24/23    Authorization Type [employer] [place] [employer]    PT Start Time 1450    PT Stop Time 1530    PT Time Calculation (min) 40 min               Past Medical History:  Diagnosis Date   Anxiety    Bipolar 1 disorder (HCC)    Depression    Past Surgical History:  Procedure Laterality Date   pelvic sling     TONSILLECTOMY     Patient Active Problem List   Diagnosis Date Noted   Panic anxiety syndrome 07/21/2014    PCP: Lizette Lango, MD   REFERRING PROVIDER: Silva Juliene SAUNDERS, DPM   REFERRING DIAG: M72.2 (ICD-10-CM) - Plantar fasciitis of right foot   THERAPY DIAG:  Muscle weakness (generalized)  Pain in right foot  Rationale for Evaluation and Treatment: Rehabilitation  ONSET DATE: Chronic  SUBJECTIVE:   SUBJECTIVE STATEMENT: Pt presents to PT with reports of no current pain or discomfort. Has been compliant with HEP.   EVAL: Pt presents to PT with reports of R heel pain over last few months. Pain is worst in morning but also increases with prolonged standing and walking. Denies N/T in R foot, is looking for more comfortable foot wear for decreasing pain.   PERTINENT HISTORY: Depression, Bipolar disorder  PAIN:  Are you having pain?  Yes: NPRS scale: 1/10 Worst: 10/10 Pain location: R heel, R foot Pain description: sharp, throbbing Aggravating factors: mornings, prolonged walking Relieving factors: medication  PRECAUTIONS: None  RED FLAGS: None   WEIGHT BEARING RESTRICTIONS: No  FALLS:  Has patient fallen in last 6 months? 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  DIAGNOSTIC FINDINGS:  See imaging  PATIENT SURVEYS:  FOTO: 43% function; 58% predicted  COGNITION: Overall cognitive status: Within functional limits for tasks assessed     SENSATION: WFL  POSTURE: rounded shoulders and forward head  PALPATION: TTP to R gastroc and R heel  LOWER EXTREMITY ROM:  Active ROM Right eval Left eval  Hip flexion    Hip extension    Hip abduction    Hip adduction    Hip internal rotation    Hip external rotation    Knee flexion    Knee extension    Ankle dorsiflexion 4 5  Ankle plantarflexion    Ankle inversion    Ankle eversion     (Blank rows = not tested)  LOWER EXTREMITY MMT:  MMT Right eval Left eval  Hip flexion    Hip extension    Hip abduction    Hip adduction    Hip internal rotation    Hip external rotation    Knee flexion    Knee extension    Ankle dorsiflexion 5 5  Ankle plantarflexion    Ankle inversion 4 5  Ankle eversion 5 5   (  Blank rows = not tested)  LOWER EXTREMITY SPECIAL TESTS:  Ankle special tests: Thompson's test: negative  FUNCTIONAL TESTS:  30 Second Sit to Stand: 13 reps  GAIT: Distance walked: 60ft Assistive device utilized: None Level of assistance: Complete Independence Comments: antalgic gait on R, decreased heel strike R   TREATMENT: [facility] Adult PT Treatment:                                                DATE: 05/09/2023 Therapeutic Exercise: Slant board stretch 2x45 Heel raise with ball 2x15 Eccentric heel raises 2in 2x10 Wobble board 2x30 fwd/bwd Tandem stance on foam 2x30 R ankle DF/inv/ev 2x15 RTB Seated heel raises 2x15 10# DB Manual Therapy: IASTM to R plantar fasica in L sidelying STM to distal L achilles in L sidelying  [facility] Adult PT  Treatment:                                                DATE: 04/29/23 Therapeutic Exercise: Rec bike L3 x 3 min Slant board stretch 2x30 Eccentric heel raises 2in 2x10 Soleus stretch x 30 R Heel raise with ball 2x10 Tandem stance on foam x 30 Bilateral ankle DF/inv/ev 2x15 RTB Manual Therapy: IASTM to R plantar fasica in L sidelying  [facility] Adult PT Treatment:                                                DATE: 04/25/23 Therapeutic Exercise: Rec bike L1 x 5 min Slant board stretch Soleus stretch Bilat heel raises, cues for neutral ankle alignment  Seated toe scrunches  Toe Yoga  Seated Plantar fasica stretch  Seated and standing arch lifts  Seated red band IR 10 x 2  Seated vs sidelying clam with green band  Manual Therapy: Self strumming of plantar fascia  Neuromuscular re-ed: Tandem stance  on and off  AIREX Tandem forward gait   [facility] Adult PT Treatment:                                                DATE: 03/27/2020 Therapeutic Exercise: Fig 4 inversion x 10 R RTB Standing gastroc stretch x 30 R Standing soleus stretch x 30 R Seated plantar fascia ball stretch x 30  PATIENT EDUCATION:  Education details: eval findings, FOTO, HEP, POC Person educated: Patient Education method: Explanation, Demonstration, and Handouts Education comprehension: verbalized understanding and returned demonstration  HOME EXERCISE PROGRAM: Access Code: [email or web address] URL: [email or web address] Date: 03/28/2023 Prepared by: Alm Kingdom  Exercises - Seated Figure 4 Ankle Inversion with Resistance  - 1 x daily - 7 x weekly - 3 sets - 10 reps - red band hold - Gastroc Stretch on Wall  - 1 x daily - 7 x weekly - 2-3 reps - 30 sec hold - Soleus Stretch on Wall  - 1 x daily - 7 x weekly - 3 reps - 30 sec hold - Seated Plantar Fascia Mobilization with Small Ball  -  1 x daily - 7 x weekly - 2 reps - 2 min hold  ASSESSMENT:  CLINICAL IMPRESSION: Pt was able to complete all  prescribed exercises with no adverse effect. Therapy today focused on improving calf length and ankle strengthening. Noted decrease in pain post manual therapy. Will continue to progress exercises as able per POC.   EVAL: Patient is a 68 y.o. F who was seen today for physical therapy evaluation and treatment for chronic R heel pain secondary to plantar fascitis. Physical findings are consistent with referring provider impression as pt demonstrates decrease in functional mobility and calf tightness. FOTO score show demonstrates decrease in subjective functional ability below PLOF. Pt would benefit from skilled PT services working on improving calf length and decreasing pain.    OBJECTIVE IMPAIRMENTS: Abnormal gait, decreased activity tolerance, decreased balance, decreased endurance, decreased mobility, difficulty walking, decreased ROM, decreased strength, and pain.   ACTIVITY LIMITATIONS: standing, squatting, stairs, transfers, and locomotion level  PARTICIPATION LIMITATIONS: driving, shopping, community activity, occupation, and yard work  PERSONAL FACTORS: 1-2 comorbidities: Depression, Bipolar disorder  are also affecting patient's functional outcome.   REHAB POTENTIAL: Excellent  CLINICAL DECISION MAKING: Stable/uncomplicated  EVALUATION COMPLEXITY: Low   GOALS: Goals reviewed with patient? No  SHORT TERM GOALS: Target date: 04/18/2023   Pt will be compliant and knowledgeable with initial HEP for improved comfort and carryover Baseline: initial HEP given  Goal status: INITIAL  2.  Pt will self report right foot pain no greater than 7/10 for improved comfort and functional ability Baseline: 10/10 at worst  Goal status: INITIAL   LONG TERM GOALS: Target date: 05/24/2023  Pt will improve FOTO function score to no less than 58% as proxy for functional improvement Baseline: 43% function Goal status: INITIAL   2.  Pt will self report right foot pain no greater than 3/10 for  improved comfort and functional ability Baseline: 10/10 at worst Goal status: INITIAL   3.  Pt will improve bilateral ankle DF to no less than 10 degrees for improving comfort, gait, and mobility Baseline: see ROM chart Goal status: INITIAL  4.  Pt will be able to get back to walking and exercising not limited by pain for improved comfort with desired recreational activity Baseline: unable Goal status: INITIAL   PLAN:  PT FREQUENCY: 1-2x/week  PT DURATION: 8 weeks  PLANNED INTERVENTIONS: 97164- PT Re-evaluation, 97110-Therapeutic exercises, 97530- Therapeutic activity, 97112- Neuromuscular re-education, 97535- Self Care, [id number]- Manual therapy, [id number]- Gait training, [id number]- Aquatic Therapy, 97014- Electrical stimulation (unattended), [id number]- Electrical stimulation (manual), 97016- Vasopneumatic device, Cryotherapy, and Moist heat  PLAN FOR NEXT SESSION: assess HEP response, calf stretching, ankle strengthening, manual to R calf   Alm JAYSON Kingdom PT  05/09/23 3:36 PM

## 2023-05-14 NOTE — Therapy (Signed)
 OUTPATIENT PHYSICAL THERAPY LOWER EXTREMITY TREATMENT   Patient Name: Jasmine Schmidt MRN: [id number] DOB:November 16, 1955, 68 y.o., female Today's Date: 05/16/2023  END OF SESSION:  PT End of Session - 05/15/23 1536     Visit Number 5    Number of Visits 16    Date for PT Re-Evaluation 05/24/23    Authorization Type [place] [employer] MCD    PT Start Time 1530    PT Stop Time 1610    PT Time Calculation (min) 40 min                Past Medical History:  Diagnosis Date   Anxiety    Bipolar 1 disorder (HCC)    Depression    Past Surgical History:  Procedure Laterality Date   pelvic sling     TONSILLECTOMY     Patient Active Problem List   Diagnosis Date Noted   Panic anxiety syndrome 07/21/2014    PCP: Lizette Lango, MD   REFERRING PROVIDER: Silva Juliene SAUNDERS, DPM   REFERRING DIAG: M72.2 (ICD-10-CM) - Plantar fasciitis of right foot   THERAPY DIAG:  Muscle weakness (generalized)  Pain in right foot  Rationale for Evaluation and Treatment: Rehabilitation  ONSET DATE: Chronic  SUBJECTIVE:   SUBJECTIVE STATEMENT: Pt presents to PT with reports of no current pain or discomfort. Has been compliant with HEP.   EVAL: Pt presents to PT with reports of R heel pain over last few months. Pain is worst in morning but also increases with prolonged standing and walking. Denies N/T in R foot, is looking for more comfortable foot wear for decreasing pain.   PERTINENT HISTORY: Depression, Bipolar disorder  PAIN:  Are you having pain?  Yes: NPRS scale: 1/10 Worst: 10/10 Pain location: R heel, R foot Pain description: sharp, throbbing Aggravating factors: mornings, prolonged walking Relieving factors: medication  PRECAUTIONS: None  RED FLAGS: None   WEIGHT BEARING RESTRICTIONS: No  FALLS:  Has patient fallen in last 6 months? 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  DIAGNOSTIC FINDINGS:  See imaging  PATIENT SURVEYS:  FOTO: 43% function; 58% predicted  COGNITION: Overall cognitive status: Within functional limits for tasks assessed     SENSATION: WFL  POSTURE: rounded shoulders and forward head  PALPATION: TTP to R gastroc and R heel  LOWER EXTREMITY ROM:  Active ROM Right eval Left eval  Hip flexion    Hip extension    Hip abduction    Hip adduction    Hip internal rotation    Hip external rotation    Knee flexion    Knee extension    Ankle dorsiflexion 4 5  Ankle plantarflexion    Ankle inversion    Ankle eversion     (Blank rows = not tested)  LOWER EXTREMITY MMT:  MMT Right eval Left eval  Hip flexion    Hip extension    Hip abduction    Hip adduction    Hip internal rotation    Hip external rotation    Knee flexion    Knee extension    Ankle dorsiflexion 5 5  Ankle plantarflexion    Ankle inversion 4 5  Ankle eversion 5  5   (Blank rows = not tested)  LOWER EXTREMITY SPECIAL TESTS:  Ankle special tests: Thompson's test: negative  FUNCTIONAL TESTS:  30 Second Sit to Stand: 13 reps  GAIT: Distance walked: 69ft Assistive device utilized: None Level of assistance: Complete Independence Comments: antalgic gait on R, decreased heel strike R   TREATMENT: [facility] Adult PT Treatment:                                                DATE: 05/15/2023 Therapeutic Exercise: Slant board stretch 2x45 Heel raise with ball 2x15 Eccentric heel raises 2in 2x10 Tandem on foam x 30 each SLS on foam x 30 R Side stepping on foam beam on heels and on toes x 4 each Towel scrunch x 60 R Marble pickups R Manual Therapy: IASTM to R plantar fasica in L sidelying STM to distal L achilles  in L sidelying  [facility] Adult PT Treatment:                                                DATE: 05/09/2023 Therapeutic Exercise: Slant board stretch 2x45 Heel raise with ball 2x15 Eccentric heel raises 2in 2x10 Wobble board 2x30 fwd/bwd Tandem stance on foam 2x30 R ankle DF/inv/ev 2x15 RTB Seated heel raises 2x15 10# DB Manual Therapy: IASTM to R plantar fasica in L sidelying STM to distal L achilles in L sidelying  [facility] Adult PT Treatment:                                                DATE: 04/29/23 Therapeutic Exercise: Rec bike L3 x 3 min Slant board stretch 2x30 Eccentric heel raises 2in 2x10 Soleus stretch x 30 R Heel raise with ball 2x10 Tandem stance on foam x 30 Bilateral ankle DF/inv/ev 2x15 RTB Manual Therapy: IASTM to R plantar fasica in L sidelying  [facility] Adult PT Treatment:                                                DATE: 04/25/23 Therapeutic Exercise: Rec bike L1 x 5 min Slant board stretch Soleus stretch Bilat heel raises, cues for neutral ankle alignment  Seated toe scrunches  Toe Yoga  Seated Plantar fasica stretch  Seated and standing arch lifts  Seated red band IR 10 x 2  Seated vs sidelying clam with green band  Manual Therapy: Self strumming of plantar fascia  Neuromuscular re-ed: Tandem stance  on and off  AIREX Tandem forward gait   [facility] Adult PT Treatment:                                                DATE: 03/27/2020 Therapeutic Exercise: Fig 4 inversion x 10 R RTB Standing gastroc stretch x 30 R Standing soleus stretch x 30 R Seated plantar fascia ball stretch  x 30  PATIENT EDUCATION:  Education details: eval findings, FOTO, HEP, POC Person educated: Patient Education method: Explanation, Demonstration, and Handouts Education comprehension: verbalized understanding and returned demonstration  HOME EXERCISE PROGRAM: Access Code: [email or web address] URL: [email or web address] Date: 03/28/2023 Prepared by: Alm Kingdom  Exercises - Seated Figure 4 Ankle Inversion with Resistance  - 1 x daily - 7 x weekly - 3 sets - 10 reps - red band hold - Gastroc Stretch on Wall  - 1 x daily - 7 x weekly - 2-3 reps - 30 sec hold - Soleus Stretch on Wall  - 1 x daily - 7 x weekly - 3 reps - 30 sec hold - Seated Plantar Fascia Mobilization with Small Ball  - 1 x daily - 7 x weekly - 2 reps - 2 min hold  ASSESSMENT:  CLINICAL IMPRESSION: Pt was able to complete all prescribed exercises with no adverse effect. Therapy today focused on improving calf length and ankle strengthening. Noted decrease in pain post manual therapy. Will continue to progress exercises as able per POC.   EVAL: Patient is a 68 y.o. F who was seen today for physical therapy evaluation and treatment for chronic R heel pain secondary to plantar fascitis. Physical findings are consistent with referring provider impression as pt demonstrates decrease in functional mobility and calf tightness. FOTO score show demonstrates decrease in subjective functional ability below PLOF. Pt would benefit from skilled PT services working on improving calf length and decreasing pain.    OBJECTIVE IMPAIRMENTS: Abnormal gait, decreased activity tolerance, decreased balance, decreased endurance, decreased mobility, difficulty walking, decreased ROM, decreased strength, and pain.   ACTIVITY LIMITATIONS: standing, squatting, stairs, transfers, and locomotion level  PARTICIPATION LIMITATIONS: driving, shopping, community activity, occupation, and yard work  PERSONAL FACTORS: 1-2 comorbidities: Depression, Bipolar disorder  are also affecting patient's functional outcome.   REHAB POTENTIAL: Excellent  CLINICAL DECISION MAKING: Stable/uncomplicated  EVALUATION COMPLEXITY: Low   GOALS: Goals reviewed with patient? No  SHORT TERM GOALS: Target date: 04/18/2023   Pt will be compliant and knowledgeable with initial HEP for improved comfort and carryover Baseline:  initial HEP given  Goal status: INITIAL  2.  Pt will self report right foot pain no greater than 7/10 for improved comfort and functional ability Baseline: 10/10 at worst  Goal status: INITIAL   LONG TERM GOALS: Target date: 05/24/2023  Pt will improve FOTO function score to no less than 58% as proxy for functional improvement Baseline: 43% function Goal status: INITIAL   2.  Pt will self report right foot pain no greater than 3/10 for improved comfort and functional ability Baseline: 10/10 at worst Goal status: INITIAL   3.  Pt will improve bilateral ankle DF to no less than 10 degrees for improving comfort, gait, and mobility Baseline: see ROM chart Goal status: INITIAL  4.  Pt will be able to get back to walking and exercising not limited by pain for improved comfort with desired recreational activity Baseline: unable Goal status: INITIAL   PLAN:  PT FREQUENCY: 1-2x/week  PT DURATION: 8 weeks  PLANNED INTERVENTIONS: 97164- PT Re-evaluation, 97110-Therapeutic exercises, 97530- Therapeutic activity, 97112- Neuromuscular re-education, 97535- Self Care, [id number]- Manual therapy, [id number]- Gait training, [id number]- Aquatic Therapy, 97014- Electrical stimulation (unattended), [id number]- Electrical stimulation (manual), 97016- Vasopneumatic device, Cryotherapy, and Moist heat  PLAN FOR NEXT SESSION: assess HEP response, calf stretching, ankle strengthening, manual to R calf   Alm JAYSON Kingdom PT  05/16/23 8:15 AM

## 2023-05-15 ENCOUNTER — Ambulatory Visit: Payer: Medicare Other

## 2023-05-15 DIAGNOSIS — M6281 Muscle weakness (generalized): Secondary | ICD-10-CM | POA: Diagnosis not present

## 2023-05-15 DIAGNOSIS — M79671 Pain in right foot: Secondary | ICD-10-CM

## 2023-05-23 ENCOUNTER — Ambulatory Visit: Payer: Medicaid Other

## 2023-05-23 DIAGNOSIS — M6281 Muscle weakness (generalized): Secondary | ICD-10-CM | POA: Diagnosis not present

## 2023-05-23 DIAGNOSIS — M79671 Pain in right foot: Secondary | ICD-10-CM

## 2023-05-23 NOTE — Therapy (Addendum)
 OUTPATIENT PHYSICAL THERAPY TREATMENT NOTE/DISCHARGE  PHYSICAL THERAPY DISCHARGE SUMMARY  Visits from Start of Care: 6  Current functional level related to goals / functional outcomes: See goals/objective   Remaining deficits: Unable to assess   Education / Equipment: HEP   Patient agrees to discharge. Patient goals were unable to assess. Patient is being discharged due to not returning since the last visit.     Patient Name: Jasmine Schmidt MRN: [id number] DOB:June 25, 1955, 68 y.o., female Today's Date: 05/23/2023  END OF SESSION:  PT End of Session - 05/23/23 1359     Visit Number 6    Number of Visits 16    Date for PT Re-Evaluation 05/24/23    Authorization Type [place] [employer]    PT Start Time 1400    PT Stop Time 1440    PT Time Calculation (min) 40 min    Activity Tolerance Patient tolerated treatment well    Behavior During Therapy WFL for tasks assessed/performed                 Past Medical History:  Diagnosis Date   Anxiety    Bipolar 1 disorder (HCC)    Depression    Past Surgical History:  Procedure Laterality Date   pelvic sling     TONSILLECTOMY     Patient Active Problem List   Diagnosis Date Noted   Panic anxiety syndrome 07/21/2014    PCP: Lizette Lango, MD   REFERRING PROVIDER: Silva Juliene SAUNDERS, DPM   REFERRING DIAG: M72.2 (ICD-10-CM) - Plantar fasciitis of right foot   THERAPY DIAG:  Muscle weakness (generalized)  Pain in right foot  Rationale for Evaluation and Treatment: Rehabilitation  ONSET DATE: Chronic  SUBJECTIVE:   SUBJECTIVE STATEMENT: Pt presents to PT with reports of decreased R foot pain. Has continued HEP compliance with no adverse effect.   EVAL: Pt presents to PT with reports of R heel pain over last few months. Pain is worst in morning but also increases with prolonged standing and walking. Denies N/T in R foot, is looking for more comfortable foot wear for decreasing pain.   PERTINENT HISTORY: Depression,  Bipolar disorder  PAIN:  Are you having pain?  Yes: NPRS scale: 1/10 Worst: 10/10 Pain location: R heel, R foot Pain description: sharp, throbbing Aggravating factors: mornings, prolonged walking Relieving factors: medication  PRECAUTIONS: None  RED FLAGS: None   WEIGHT BEARING RESTRICTIONS: No  FALLS:  Has patient fallen in last 6 months? 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  DIAGNOSTIC FINDINGS:  See imaging  PATIENT SURVEYS:  FOTO: 43% function; 58% predicted  COGNITION: Overall cognitive status: Within functional limits for tasks assessed     SENSATION: WFL  POSTURE: rounded shoulders and forward head  PALPATION: TTP to R gastroc and R heel  LOWER EXTREMITY ROM:  Active ROM Right eval Left eval  Hip flexion    Hip extension    Hip abduction    Hip adduction    Hip internal rotation    Hip external rotation    Knee flexion    Knee extension    Ankle dorsiflexion 4 5  Ankle plantarflexion    Ankle inversion  Ankle eversion     (Blank rows = not tested)  LOWER EXTREMITY MMT:  MMT Right eval Left eval  Hip flexion    Hip extension    Hip abduction    Hip adduction    Hip internal rotation    Hip external rotation    Knee flexion    Knee extension    Ankle dorsiflexion 5 5  Ankle plantarflexion    Ankle inversion 4 5  Ankle eversion 5 5   (Blank rows = not tested)  LOWER EXTREMITY SPECIAL TESTS:  Ankle special tests: Thompson's test: negative  FUNCTIONAL TESTS:  30 Second Sit to Stand: 13 reps  GAIT: Distance walked: 61ft Assistive device utilized: None Level of assistance: Complete  Independence Comments: antalgic gait on R, decreased heel strike R   TREATMENT: [facility] Adult PT Treatment:                                                DATE: 05/23/2023 Therapeutic Exercise: Slant board stretch 2x45 Heel raise with ball 2x15 Tandem on foam 2x30 each SLS on foam x 30 R Side stepping on foam beam on heels and on toes x 2 each Towel scrunch x 60 R Wobble board x 60 fwd/bwd R ankle DF/Ev/Inv 2x15 GTB Manual Therapy: IASTM to R plantar fasica in L sidelying STM to distal L achilles in L sidelying  [facility] Adult PT Treatment:                                                DATE: 05/15/2023 Therapeutic Exercise: Slant board stretch 2x45 Heel raise with ball 2x15 Eccentric heel raises 2in 2x10 Tandem on foam x 30 each SLS on foam x 30 R Side stepping on foam beam on heels and on toes x 4 each Towel scrunch x 60 R Marble pickups R Manual Therapy: IASTM to R plantar fasica in L sidelying STM to distal L achilles in L sidelying  [facility] Adult PT Treatment:                                                DATE: 05/09/2023 Therapeutic Exercise: Slant board stretch 2x45 Heel raise with ball 2x15 Eccentric heel raises 2in 2x10 Wobble board 2x30 fwd/bwd Tandem stance on foam 2x30 R ankle DF/inv/ev 2x15 RTB Seated heel raises 2x15 10# DB Manual Therapy: IASTM to R plantar fasica in L sidelying STM to distal L achilles in L sidelying  [facility] Adult PT Treatment:                                                DATE: 04/29/23 Therapeutic Exercise: Rec bike L3 x 3 min Slant board stretch 2x30 Eccentric heel raises 2in 2x10 Soleus stretch x 30 R Heel raise with ball 2x10 Tandem stance on foam x 30 Bilateral ankle DF/inv/ev 2x15 RTB Manual Therapy: IASTM to R plantar fasica in L sidelying  [facility] Adult PT Treatment:  DATE: 04/25/23 Therapeutic Exercise: Rec bike L1 x 5 min Slant board stretch Soleus stretch Bilat heel  raises, cues for neutral ankle alignment  Seated toe scrunches  Toe Yoga  Seated Plantar fasica stretch  Seated and standing arch lifts  Seated red band IR 10 x 2  Seated vs sidelying clam with green band  Manual Therapy: Self strumming of plantar fascia  Neuromuscular re-ed: Tandem stance  on and off  AIREX Tandem forward gait   [facility] Adult PT Treatment:                                                DATE: 03/27/2020 Therapeutic Exercise: Fig 4 inversion x 10 R RTB Standing gastroc stretch x 30 R Standing soleus stretch x 30 R Seated plantar fascia ball stretch x 30  PATIENT EDUCATION:  Education details: eval findings, FOTO, HEP, POC Person educated: Patient Education method: Explanation, Demonstration, and Handouts Education comprehension: verbalized understanding and returned demonstration  HOME EXERCISE PROGRAM: Access Code: [email or web address] URL: [email or web address] Date: 03/28/2023 Prepared by: Alm Kingdom  Exercises - Seated Figure 4 Ankle Inversion with Resistance  - 1 x daily - 7 x weekly - 3 sets - 10 reps - red band hold - Gastroc Stretch on Wall  - 1 x daily - 7 x weekly - 2-3 reps - 30 sec hold - Soleus Stretch on Wall  - 1 x daily - 7 x weekly - 3 reps - 30 sec hold - Seated Plantar Fascia Mobilization with Small Ball  - 1 x daily - 7 x weekly - 2 reps - 2 min hold  ASSESSMENT:  CLINICAL IMPRESSION: Pt was able to complete all prescribed exercises with no adverse effect. Therapy again focused on improving distal LE strength and balance. Pt is progressing well, will continue to be seen and progressed as able.   EVAL: Patient is a 68 y.o. F who was seen today for physical therapy evaluation and treatment for chronic R heel pain secondary to plantar fascitis. Physical findings are consistent with referring provider impression as pt demonstrates decrease in functional mobility and calf tightness. FOTO score show demonstrates decrease in subjective  functional ability below PLOF. Pt would benefit from skilled PT services working on improving calf length and decreasing pain.    OBJECTIVE IMPAIRMENTS: Abnormal gait, decreased activity tolerance, decreased balance, decreased endurance, decreased mobility, difficulty walking, decreased ROM, decreased strength, and pain.   ACTIVITY LIMITATIONS: standing, squatting, stairs, transfers, and locomotion level  PARTICIPATION LIMITATIONS: driving, shopping, community activity, occupation, and yard work  PERSONAL FACTORS: 1-2 comorbidities: Depression, Bipolar disorder are also affecting patient's functional outcome.   REHAB POTENTIAL: Excellent  CLINICAL DECISION MAKING: Stable/uncomplicated  EVALUATION COMPLEXITY: Low   GOALS: Goals reviewed with patient? No  SHORT TERM GOALS: Target date: 04/18/2023   Pt will be compliant and knowledgeable with initial HEP for improved comfort and carryover Baseline: initial HEP given  Goal status: INITIAL  2.  Pt will self report right foot pain no greater than 7/10 for improved comfort and functional ability Baseline: 10/10 at worst  Goal status: INITIAL   LONG TERM GOALS: Target date: 05/24/2023  Pt will improve FOTO function score to no less than 58% as proxy for functional improvement Baseline: 43% function Goal status: INITIAL   2.  Pt will self report right foot pain no greater than 3/10  for improved comfort and functional ability Baseline: 10/10 at worst Goal status: INITIAL   3.  Pt will improve bilateral ankle DF to no less than 10 degrees for improving comfort, gait, and mobility Baseline: see ROM chart Goal status: INITIAL  4.  Pt will be able to get back to walking and exercising not limited by pain for improved comfort with desired recreational activity Baseline: unable Goal status: INITIAL   PLAN:  PT FREQUENCY: 1-2x/week  PT DURATION: 8 weeks  PLANNED INTERVENTIONS: 97164- PT Re-evaluation, 97110-Therapeutic  exercises, 97530- Therapeutic activity, 97112- Neuromuscular re-education, 97535- Self Care, [id number]- Manual therapy, [id number]- Gait training, [id number]- Aquatic Therapy, 97014- Electrical stimulation (unattended), [id number]- Electrical stimulation (manual), 97016- Vasopneumatic device, Cryotherapy, and Moist heat  PLAN FOR NEXT SESSION: assess HEP response, calf stretching, ankle strengthening, manual to R calf   Alm JAYSON Kingdom PT  05/23/23 4:47 PM

## 2023-05-29 ENCOUNTER — Ambulatory Visit: Payer: Medicare Other | Admitting: Physical Therapy

## 2023-07-10 ENCOUNTER — Encounter (HOSPITAL_BASED_OUTPATIENT_CLINIC_OR_DEPARTMENT_OTHER): Payer: Self-pay

## 2023-07-10 ENCOUNTER — Emergency Department (HOSPITAL_BASED_OUTPATIENT_CLINIC_OR_DEPARTMENT_OTHER): Payer: Medicare (Managed Care)

## 2023-07-10 ENCOUNTER — Other Ambulatory Visit: Payer: Self-pay

## 2023-07-10 ENCOUNTER — Emergency Department (HOSPITAL_BASED_OUTPATIENT_CLINIC_OR_DEPARTMENT_OTHER)
Admission: EM | Admit: 2023-07-10 | Discharge: 2023-07-11 | Disposition: A | Discharge status: Home / Self Care | Payer: Medicare (Managed Care) | Attending: Emergency Medicine | Admitting: Emergency Medicine

## 2023-07-10 DIAGNOSIS — S60512A Abrasion of left hand, initial encounter: Secondary | ICD-10-CM | POA: Diagnosis not present

## 2023-07-10 DIAGNOSIS — S0081XA Abrasion of other part of head, initial encounter: Secondary | ICD-10-CM | POA: Diagnosis not present

## 2023-07-10 DIAGNOSIS — S0990XA Unspecified injury of head, initial encounter: Secondary | ICD-10-CM | POA: Diagnosis present

## 2023-07-10 DIAGNOSIS — W108XXA Fall (on) (from) other stairs and steps, initial encounter: Secondary | ICD-10-CM | POA: Insufficient documentation

## 2023-07-10 DIAGNOSIS — W19XXXA Unspecified fall, initial encounter: Secondary | ICD-10-CM

## 2023-07-10 NOTE — ED Triage Notes (Signed)
 Pt POV after mechanical fall, tripped on stairs, hit head on concrete, -LOC, no thinners. Abrasion above L eyebrow and L cheek. Also reporting L wrist pain

## 2023-07-10 NOTE — Discharge Instructions (Addendum)
 CT look good! Please clean the affected areas with soap and water.  You can put an ointment on it a couple times a day this could be just Vaseline or could be antibiotic ointment if you choose.  Please return for rapid spreading redness or if you develop a fever.  Please return for sudden worsening headache confusion or vomiting.   Follow-up with your family doctor in the office.  Talk with them about updating her tetanus.

## 2023-07-10 NOTE — ED Provider Notes (Signed)
  EMERGENCY DEPARTMENT AT [facility] Provider Note   CSN: [id number] Arrival date & time: 07/10/23  2117     History  Chief Complaint  Patient presents with   Marletta Lor    Jasmine Schmidt is a 68 y.o. female.  68 yo F with a chief complaint of a fall.  Patient states that she was walking up a flight of stairs and when she turned to waved someone and she lost her balance and fell.  Struck the left side of her face and hurt her left wrist.  She denies any other specific areas of injury.  Denies neck pain back pain chest pain abdominal pain.  Has been able to ambulate since without issue.   Fall       Home Medications Prior to Admission medications   Medication Sig Start Date End Date Taking? Authorizing Provider  ALPRAZolam Prudy Feeler) 0.25 MG tablet Take one every 8-12 hours only if needed for severe anxiety 07/21/14   Peyton Najjar, MD  DULoxetine (CYMBALTA) 60 MG capsule Take 120 mg by mouth daily.    [provider]  lamoTRIgine (LAMICTAL) 200 MG tablet Take 200 mg by mouth daily.    [provider]  lisdexamfetamine (VYVANSE) 70 MG capsule Take 70 mg by mouth daily.    [provider]  loperamide (IMODIUM) 2 MG capsule Take 1 capsule (2 mg total) by mouth 4 (four) times daily as needed for diarrhea or loose stools. 07/17/22   Jeanelle Malling, PA  meloxicam (MOBIC) 15 MG tablet Take 1 tablet (15 mg total) by mouth daily. 02/11/23   McDonald, Rachelle Hora, DPM  methylPREDNISolone (MEDROL DOSEPAK) 4 MG TBPK tablet 6 day dose pack - take as directed 02/05/23   Edwin Cap, DPM  ondansetron (ZOFRAN-ODT) 4 MG disintegrating tablet Take 1 tablet (4 mg total) by mouth every 8 (eight) hours as needed for nausea or vomiting. 07/17/22   Jeanelle Malling, PA      Allergies    Patient has no known allergies.    Review of Systems   Review of Systems  Physical Exam Updated Vital Signs BP (!) 131/59   Pulse 78   Temp 98.5 F (36.9 C)   Resp 18   Ht 5\' 7"  (1.702  m)   Wt 102.1 kg   SpO2 98%   BMI 35.24 kg/m  Physical Exam Vitals and nursing note reviewed.  Constitutional:      General: She is not in acute distress.    Appearance: She is well-developed. She is not diaphoretic.  HENT:     Head: Normocephalic.     Comments: Abrasion to the left side of the face. Eyes:     Pupils: Pupils are equal, round, and reactive to light.  Cardiovascular:     Rate and Rhythm: Normal rate and regular rhythm.     Heart sounds: No murmur heard.    No friction rub. No gallop.  Pulmonary:     Effort: Pulmonary effort is normal.     Breath sounds: No wheezing or rales.  Abdominal:     General: There is no distension.     Palpations: Abdomen is soft.     Tenderness: There is no abdominal tenderness.  Musculoskeletal:        General: No tenderness.     Cervical back: Normal range of motion and neck supple.     Comments: Abrasion to the dorsum of the left hand without any limit to range of motion  no edema  Skin:    General: Skin is warm and dry.  Neurological:     Mental Status: She is alert and oriented to person, place, and time.  Psychiatric:        Behavior: Behavior normal.     ED Results / Procedures / Treatments   Labs (all labs ordered are listed, but only abnormal results are displayed) Labs Reviewed - No data to display  EKG None  Radiology CT Head Wo Contrast Result Date: 07/10/2023 CLINICAL DATA:  Mechanical fall. Hit head on concrete. Abrasion above left eyebrow and left cheek. EXAM: CT HEAD WITHOUT CONTRAST CT CERVICAL SPINE WITHOUT CONTRAST TECHNIQUE: Multidetector CT imaging of the head and cervical spine was performed following the standard protocol without intravenous contrast. Multiplanar CT image reconstructions of the cervical spine were also generated. RADIATION DOSE REDUCTION: This exam was performed according to the departmental dose-optimization program which includes automated exposure control, adjustment of the mA and/or kV  according to patient size and/or use of iterative reconstruction technique. COMPARISON:  None Available. FINDINGS: CT HEAD FINDINGS Brain: No intracranial hemorrhage, mass effect, or evidence of acute infarct. No hydrocephalus. No extra-axial fluid collection. Vascular: No hyperdense vessel or unexpected calcification. Skull: No fracture or focal lesion.  Left forehead contusion. Sinuses/Orbits: No acute finding. Paranasal sinuses and mastoid air cells are well aerated. Other: None. CT CERVICAL SPINE FINDINGS Alignment: No evidence of traumatic malalignment. Skull base and vertebrae: No acute fracture. No primary bone lesion or focal pathologic process. Soft tissues and spinal canal: No prevertebral fluid or swelling. No visible canal hematoma. Disc levels: Degenerative spondylosis, disc space height loss, and degenerative endplate changes greatest at C5-C6 and C6-C7 where it is moderate to advanced. Ankylosis of the right C3 and C4 facets. Posterior disc osteophyte complex at C5-C6 and C6-C7 causes mild effacement of the ventral thecal sac. No severe spinal canal narrowing. Upper chest: 1.5 cm left thyroid nodule. Other: None. IMPRESSION: No acute intracranial abnormality. No cervical spine fracture. 1.5 cm left thyroid nodule. Recommend nonemergent thyroid US (ref: J Am Coll Radiol. 2015 Feb;12(2): 143-50). Electronically Signed   By: Minerva Fester M.D.   On: 07/10/2023 23:45   CT Cervical Spine Wo Contrast Result Date: 07/10/2023 CLINICAL DATA:  Mechanical fall. Hit head on concrete. Abrasion above left eyebrow and left cheek. EXAM: CT HEAD WITHOUT CONTRAST CT CERVICAL SPINE WITHOUT CONTRAST TECHNIQUE: Multidetector CT imaging of the head and cervical spine was performed following the standard protocol without intravenous contrast. Multiplanar CT image reconstructions of the cervical spine were also generated. RADIATION DOSE REDUCTION: This exam was performed according to the departmental dose-optimization  program which includes automated exposure control, adjustment of the mA and/or kV according to patient size and/or use of iterative reconstruction technique. COMPARISON:  None Available. FINDINGS: CT HEAD FINDINGS Brain: No intracranial hemorrhage, mass effect, or evidence of acute infarct. No hydrocephalus. No extra-axial fluid collection. Vascular: No hyperdense vessel or unexpected calcification. Skull: No fracture or focal lesion.  Left forehead contusion. Sinuses/Orbits: No acute finding. Paranasal sinuses and mastoid air cells are well aerated. Other: None. CT CERVICAL SPINE FINDINGS Alignment: No evidence of traumatic malalignment. Skull base and vertebrae: No acute fracture. No primary bone lesion or focal pathologic process. Soft tissues and spinal canal: No prevertebral fluid or swelling. No visible canal hematoma. Disc levels: Degenerative spondylosis, disc space height loss, and degenerative endplate changes greatest at C5-C6 and C6-C7 where it is moderate to advanced. Ankylosis of the right C3 and  C4 facets. Posterior disc osteophyte complex at C5-C6 and C6-C7 causes mild effacement of the ventral thecal sac. No severe spinal canal narrowing. Upper chest: 1.5 cm left thyroid nodule. Other: None. IMPRESSION: No acute intracranial abnormality. No cervical spine fracture. 1.5 cm left thyroid nodule. Recommend nonemergent thyroid US (ref: J Am Coll Radiol. 2015 Feb;12(2): 143-50). Electronically Signed   By: Minerva Fester M.D.   On: 07/10/2023 23:45   DG Wrist Complete Left Result Date: 07/10/2023 CLINICAL DATA:  Wrist pain after fall. EXAM: LEFT WRIST - COMPLETE 3+ VIEW COMPARISON:  None Available. FINDINGS: There is no evidence of fracture or dislocation. There is no evidence of arthropathy or other focal bone abnormality. Mild soft tissue edema. IMPRESSION: No fracture or subluxation of the left wrist. Electronically Signed   By: Narda Rutherford M.D.   On: 07/10/2023 22:49     Procedures Procedures    Medications Ordered in ED Medications - No data to display  ED Course/ Medical Decision Making/ A&P                                 Medical Decision Making Amount and/or Complexity of Data Reviewed Radiology: ordered.   68 yo F with a chief complaints of a fall.  Nonsyncopal by history.  Complaining of closed head injury and left wrist pain.  Plain film of the left wrist independently interpreted by me without fracture.  CT of the head on my independent interpretation without obvious intracranial hemorrhage.  CT of the C-spine without obvious C-spine fracture.  Awaiting formal radiology read.  Radiology reviewed also negative.  Will discharge home.  PCP follow-up.  I did offer to update the patient's tetanus however she is declining.  She describes a fear of needles.  I discussed that tetanus could be fatal.  She still declining.  PCP follow-up.  11:50 PM:  I have discussed the diagnosis/risks/treatment options with the patient and family.  Evaluation and diagnostic testing in the emergency department does not suggest an emergent condition requiring admission or immediate intervention beyond what has been performed at this time.  They will follow up with PCP. We also discussed returning to the ED immediately if new or worsening sx occur. We discussed the sx which are most concerning (e.g., sudden worsening pain, fever, inability to tolerate by mouth, stroke s/sx, rapid spreading redness) that necessitate immediate return. Medications administered to the patient during their visit and any new prescriptions provided to the patient are listed below.  Medications given during this visit Medications - No data to display   The patient appears reasonably screen and/or stabilized for discharge and I doubt any other medical condition or other [facility] requiring further screening, evaluation, or treatment in the ED at this time prior to discharge.          Final  Clinical Impression(s) / ED Diagnoses Final diagnoses:  Fall, initial encounter  Abrasion of face, initial encounter  Hand abrasion, left, initial encounter    Rx / DC Orders ED Discharge Orders     None         Melene Plan, DO 07/10/23 2351

## 2024-02-26 ENCOUNTER — Emergency Department (HOSPITAL_COMMUNITY): Payer: Medicare (Managed Care)

## 2024-02-26 ENCOUNTER — Encounter (HOSPITAL_COMMUNITY): Payer: Self-pay | Admitting: Emergency Medicine

## 2024-02-26 ENCOUNTER — Emergency Department (HOSPITAL_COMMUNITY)
Admission: EM | Admit: 2024-02-26 | Discharge: 2024-02-26 | Disposition: A | Discharge status: Home / Self Care | Payer: Medicare (Managed Care) | Attending: Emergency Medicine | Admitting: Emergency Medicine

## 2024-02-26 ENCOUNTER — Other Ambulatory Visit: Payer: Self-pay

## 2024-02-26 DIAGNOSIS — N281 Cyst of kidney, acquired: Secondary | ICD-10-CM | POA: Insufficient documentation

## 2024-02-26 DIAGNOSIS — K449 Diaphragmatic hernia without obstruction or gangrene: Secondary | ICD-10-CM | POA: Insufficient documentation

## 2024-02-26 DIAGNOSIS — I700 Atherosclerosis of aorta: Secondary | ICD-10-CM | POA: Insufficient documentation

## 2024-02-26 DIAGNOSIS — K2900 Acute gastritis without bleeding: Secondary | ICD-10-CM | POA: Insufficient documentation

## 2024-02-26 DIAGNOSIS — K429 Umbilical hernia without obstruction or gangrene: Secondary | ICD-10-CM | POA: Diagnosis not present

## 2024-02-26 DIAGNOSIS — R1012 Left upper quadrant pain: Secondary | ICD-10-CM

## 2024-02-26 DIAGNOSIS — R1013 Epigastric pain: Secondary | ICD-10-CM | POA: Diagnosis present

## 2024-02-26 DIAGNOSIS — N200 Calculus of kidney: Secondary | ICD-10-CM | POA: Diagnosis not present

## 2024-02-26 LAB — CBC WITH DIFFERENTIAL/PLATELET
Abs Immature Granulocytes: 0.01 K/uL (ref 0.00–0.07)
Basophils Absolute: 0.1 K/uL (ref 0.0–0.1)
Basophils Relative: 1 %
Eosinophils Absolute: 0.2 K/uL (ref 0.0–0.5)
Eosinophils Relative: 4 %
HCT: 38.6 % (ref 36.0–46.0)
Hemoglobin: 12.9 g/dL (ref 12.0–15.0)
Immature Granulocytes: 0 %
Lymphocytes Relative: 25 %
Lymphs Abs: 1.3 K/uL (ref 0.7–4.0)
MCH: 29.3 pg (ref 26.0–34.0)
MCHC: 33.4 g/dL (ref 30.0–36.0)
MCV: 87.7 fL (ref 80.0–100.0)
Monocytes Absolute: 0.3 K/uL (ref 0.1–1.0)
Monocytes Relative: 5 %
Neutro Abs: 3.5 K/uL (ref 1.7–7.7)
Neutrophils Relative %: 65 %
Platelets: 188 K/uL (ref 150–400)
RBC: 4.4 MIL/uL (ref 3.87–5.11)
RDW: 11.8 % (ref 11.5–15.5)
WBC: 5.4 K/uL (ref 4.0–10.5)
nRBC: 0 % (ref 0.0–0.2)

## 2024-02-26 LAB — COMPREHENSIVE METABOLIC PANEL WITH GFR
ALT: 17 U/L (ref 0–44)
AST: 34 U/L (ref 15–41)
Albumin: 3.7 g/dL (ref 3.5–5.0)
Alkaline Phosphatase: 113 U/L (ref 38–126)
Anion gap: 8 (ref 5–15)
BUN: 16 mg/dL (ref 8–23)
CO2: 22 mmol/L (ref 22–32)
Calcium: 8.8 mg/dL — ABNORMAL LOW (ref 8.9–10.3)
Chloride: 109 mmol/L (ref 98–111)
Creatinine, Ser: 0.72 mg/dL (ref 0.44–1.00)
GFR, Estimated: >60 mL/min (ref >60)
Glucose, Bld: 113 mg/dL — ABNORMAL HIGH (ref 70–99)
Potassium: 4.2 mmol/L (ref 3.5–5.1)
Sodium: 139 mmol/L (ref 135–145)
Total Bilirubin: 0.8 mg/dL (ref 0.0–1.2)
Total Protein: 6.9 g/dL (ref 6.5–8.1)

## 2024-02-26 LAB — I-STAT CHEM 8, ED
BUN: 17 mg/dL (ref 8–23)
Calcium, Ion: 1.29 mmol/L (ref 1.15–1.40)
Chloride: 106 mmol/L (ref 98–111)
Creatinine, Ser: 0.7 mg/dL (ref 0.44–1.00)
Glucose, Bld: 109 mg/dL — ABNORMAL HIGH (ref 70–99)
HCT: 36 % (ref 36.0–46.0)
Hemoglobin: 12.2 g/dL (ref 12.0–15.0)
Potassium: 4.3 mmol/L (ref 3.5–5.1)
Sodium: 144 mmol/L (ref 135–145)
TCO2: 27 mmol/L (ref 22–32)

## 2024-02-26 LAB — LIPASE, BLOOD: Lipase: 48 U/L (ref 11–51)

## 2024-02-26 LAB — TROPONIN I (HIGH SENSITIVITY)
Troponin I (High Sensitivity): <2 ng/L (ref <18)
Troponin I (High Sensitivity): 3 ng/L (ref <18)

## 2024-02-26 LAB — D-DIMER, QUANTITATIVE: D-Dimer, Quant: 1.37 ug{FEU}/mL — ABNORMAL HIGH (ref 0.00–0.50)

## 2024-02-26 MED ORDER — ALUM & MAG HYDROXIDE-SIMETH 200-200-20 MG/5ML PO SUSP
30.0000 mL | Freq: Once | ORAL | Status: AC
  Administered 2024-02-26: 30 mL via ORAL
  Filled 2024-02-26: qty 30

## 2024-02-26 MED ORDER — FENTANYL CITRATE (PF) 50 MCG/ML IJ SOSY
50.0000 ug | PREFILLED_SYRINGE | Freq: Once | INTRAMUSCULAR | Status: DC
  Filled 2024-02-26: qty 1

## 2024-02-26 MED ORDER — FAMOTIDINE IN NACL 20-0.9 MG/50ML-% IV SOLN
20.0000 mg | Freq: Once | INTRAVENOUS | Status: AC
  Administered 2024-02-26: 20 mg via INTRAVENOUS
  Filled 2024-02-26: qty 50

## 2024-02-26 MED ORDER — PANTOPRAZOLE SODIUM 20 MG PO TBEC: 20.0000 mg | DELAYED_RELEASE_TABLET | Freq: Every day | ORAL | 0 refills | Status: AC

## 2024-02-26 MED ORDER — FAMOTIDINE 20 MG PO TABS: 20.0000 mg | ORAL_TABLET | Freq: Two times a day (BID) | ORAL | 0 refills | Status: AC

## 2024-02-26 MED ORDER — LIDOCAINE VISCOUS HCL 2 % MT SOLN
15.0000 mL | Freq: Once | OROMUCOSAL | Status: AC
  Administered 2024-02-26: 15 mL via ORAL
  Filled 2024-02-26: qty 15

## 2024-02-26 MED ORDER — LACTATED RINGERS IV BOLUS
1000.0000 mL | Freq: Once | INTRAVENOUS | Status: AC
  Administered 2024-02-26: 1000 mL via INTRAVENOUS

## 2024-02-26 MED ORDER — IOHEXOL 350 MG/ML SOLN
75.0000 mL | Freq: Once | INTRAVENOUS | Status: AC | PRN
  Administered 2024-02-26: 75 mL via INTRAVENOUS

## 2024-02-26 MED ORDER — ONDANSETRON HCL 4 MG/2ML IJ SOLN
4.0000 mg | Freq: Three times a day (TID) | INTRAMUSCULAR | Status: DC | PRN
  Filled 2024-02-26: qty 2

## 2024-02-26 NOTE — ED Provider Notes (Signed)
  EMERGENCY DEPARTMENT AT [facility] Provider Note   CSN: [id number] Arrival date & time: 02/26/24  0740     Patient presents with: Abdominal Pain   Jasmine Schmidt is a 68 y.o. female.    Abdominal Pain    68 year old female with medical history significant for anxiety, depression, bipolar disorder, prior cholecystectomy who presents emergency department with epigastric and left upper quadrant abdominal pain.  The pain woke her up from sleep.  It is eased off somewhat after a 50 mcg of fentanyl and route with EMS.  She denies any nausea, states that she has been burping frequently, denies any vomiting.  She denies any diarrhea.  Pain is sharp and stabbing.  No radiation to the back.  Her last bowel movement was yesterday and was normal however she is not passing gas today.  She denies any alcohol use.  Denies any history of problems with her pancreas.  Prior to Admission medications   Medication Sig Start Date End Date Taking? Authorizing Provider  famotidine (PEPCID) 20 MG tablet Take 1 tablet (20 mg total) by mouth 2 (two) times daily. 02/26/24  Yes Jerrol Agent, MD  pantoprazole (PROTONIX) 20 MG tablet Take 1 tablet (20 mg total) by mouth daily. 02/26/24  Yes Jerrol Agent, MD  ALPRAZolam  (XANAX ) 0.25 MG tablet Take one every 8-12 hours only if needed for severe anxiety 07/21/14   Tish Alm DEL, MD  DULoxetine (CYMBALTA) 60 MG capsule Take 120 mg by mouth daily.    [provider]  lamoTRIgine (LAMICTAL) 200 MG tablet Take 200 mg by mouth daily.    [provider]  lisdexamfetamine (VYVANSE) 70 MG capsule Take 70 mg by mouth daily.    [provider]  loperamide  (IMODIUM ) 2 MG capsule Take 1 capsule (2 mg total) by mouth 4 (four) times daily as needed for diarrhea or loose stools. 07/17/22   Ladora Congress, PA  meloxicam  (MOBIC ) 15 MG tablet Take 1 tablet (15 mg total) by mouth daily. 02/11/23   McDonald, Juliene SAUNDERS, DPM  methylPREDNISolone   (MEDROL  DOSEPAK) 4 MG TBPK tablet 6 day dose pack - take as directed 02/05/23   Silva Juliene SAUNDERS, DPM  ondansetron  (ZOFRAN -ODT) 4 MG disintegrating tablet Take 1 tablet (4 mg total) by mouth every 8 (eight) hours as needed for nausea or vomiting. 07/17/22   Ladora Congress, PA    Allergies: Patient has no known allergies.    Review of Systems  Gastrointestinal:  Positive for abdominal pain.  All other systems reviewed and are negative.   Updated Vital Signs BP 129/67   Pulse 73   Temp 97.6 F (36.4 C) (Oral)   Resp 11   Ht 5' 8 (1.727 m)   Wt 99.8 kg   SpO2 99%   BMI 33.45 kg/m   Physical Exam Vitals and nursing note reviewed.  Constitutional:      General: She is not in acute distress.    Appearance: She is well-developed.  HENT:     Head: Normocephalic and atraumatic.  Eyes:     Conjunctiva/sclera: Conjunctivae normal.  Cardiovascular:     Rate and Rhythm: Normal rate and regular rhythm.     Heart sounds: No murmur heard. Pulmonary:     Effort: Pulmonary effort is normal. No respiratory distress.     Breath sounds: Normal breath sounds.  Abdominal:     Palpations: Abdomen is soft.     Tenderness: There is abdominal tenderness in the epigastric area and  left upper quadrant.  Musculoskeletal:        General: No swelling.     Cervical back: Neck supple.  Skin:    General: Skin is warm and dry.     Capillary Refill: Capillary refill takes less than 2 seconds.  Neurological:     Mental Status: She is alert.  Psychiatric:        Mood and Affect: Mood normal.     (all labs ordered are listed, but only abnormal results are displayed) Labs Reviewed  COMPREHENSIVE METABOLIC PANEL WITH GFR - Abnormal; Notable for the following components:      Result Value   Glucose, Bld 113 (*)    Calcium 8.8 (*)    All other components within normal limits  D-DIMER, QUANTITATIVE - Abnormal; Notable for the following components:   D-Dimer, Quant 1.37 (*)    All other components within  normal limits  I-STAT CHEM 8, ED - Abnormal; Notable for the following components:   Glucose, Bld 109 (*)    All other components within normal limits  CBC WITH DIFFERENTIAL/PLATELET  LIPASE, BLOOD  TROPONIN I (HIGH SENSITIVITY)  TROPONIN I (HIGH SENSITIVITY)    EKG: EKG Interpretation Date/Time:  Wednesday February 26 2024 07:50:30 EDT Ventricular Rate:  62 PR Interval:  157 QRS Duration:  95 QT Interval:  395 QTC Calculation: 402 R Axis:   55  Text Interpretation: Sinus rhythm Borderline ST elevation, inferior leads Baseline wander in lead(s) V1 Confirmed by Jerrol Agent ([id number]) on 02/26/2024 8:54:45 AM  Radiology: CT Angio Chest PE W and/or Wo Contrast Result Date: 02/26/2024 CLINICAL DATA:  Positive D-dimer. Low to intermediate probability for pulmonary embolism. EXAM: CT ANGIOGRAPHY CHEST WITH CONTRAST TECHNIQUE: Multidetector CT imaging of the chest was performed using the standard protocol during bolus administration of intravenous contrast. Multiplanar CT image reconstructions and MIPs were obtained to evaluate the vascular anatomy. RADIATION DOSE REDUCTION: This exam was performed according to the departmental dose-optimization program which includes automated exposure control, adjustment of the mA and/or kV according to patient size and/or use of iterative reconstruction technique. CONTRAST:  75mL OMNIPAQUE IOHEXOL 350 MG/ML SOLN COMPARISON:  CT abdomen 02/26/2024 FINDINGS: Cardiovascular: Heart is normal size. Calcified plaque over the left anterior descending coronary artery. Thoracic aorta is normal in caliber. Pulmonary arterial system is well opacified without evidence of emboli. Remaining vascular structures are unremarkable. Mediastinum/Nodes: No mediastinal or hilar adenopathy. Remaining mediastinal structures are unremarkable. Lungs/Pleura: Lungs are adequately inflated without acute airspace consolidation or effusion. Airways are normal. Upper Abdomen: Minimal calcified  plaque over the abdominal aorta. Previous cholecystectomy. Partially visualized hypodensity over the upper pole right kidney unchanged from previous exam and likely a cyst. No acute findings in the visualized upper abdomen. Musculoskeletal: Biphasic curvature of the thoracolumbar spine. No focal abnormality. Review of the MIP images confirms the above findings. IMPRESSION: 1. No acute cardiopulmonary disease and no evidence of pulmonary embolism. 2. Aortic atherosclerosis. Atherosclerotic coronary artery disease. 3. Partially visualized hypodensity over the upper pole right kidney unchanged from previous exam and likely a cyst. Recommend renal ultrasound on an elective basis for further characterization. Aortic Atherosclerosis (ICD10-I70.0). Electronically Signed   By: Toribio Agreste M.D.   On: 02/26/2024 13:48   CT ABDOMEN PELVIS W CONTRAST Result Date: 02/26/2024 CLINICAL DATA:  Left upper quadrant and epigastric pain. EXAM: CT ABDOMEN AND PELVIS WITH CONTRAST TECHNIQUE: Multidetector CT imaging of the abdomen and pelvis was performed using the standard protocol following bolus administration of intravenous contrast.  RADIATION DOSE REDUCTION: This exam was performed according to the departmental dose-optimization program which includes automated exposure control, adjustment of the mA and/or kV according to patient size and/or use of iterative reconstruction technique. CONTRAST:  75mL OMNIPAQUE IOHEXOL 350 MG/ML SOLN COMPARISON:  10/17/2003 FINDINGS: Lower chest: Heart is normal size. Suggestion of small sliding hiatal hernia. Visualized lung bases are clear. Hepatobiliary: Previous cholecystectomy. Liver and biliary tree are normal. Pancreas: Normal. Spleen: Normal. Adrenals/Urinary Tract: Adrenal glands are normal. Kidneys are normal in size with mild bilateral nephrolithiasis. Largest stone over the upper pole left kidney measuring 5-6 mm. No significant hydronephrosis or perinephric inflammation/fluid. 2.7  cm hypodensity over the mid to upper pole right kidney with time CT measurements of 20 likely a cyst. Ureters and bladder are normal. Stomach/Bowel: Possible small sliding hiatal hernia as the stomach is otherwise unremarkable. Small bowel is normal. Appendix is not visualized. Colon is normal. Vascular/Lymphatic: Mild calcified plaque over the abdominal aorta which is normal in caliber. Remaining vascular structures are unremarkable. No adenopathy. Reproductive: Uterus and bilateral adnexa are unremarkable. Other: No significant free fluid or focal inflammatory change. Small umbilical hernia containing only peritoneal fat unchanged. Small left inguinal hernia containing only peritoneal fat. Musculoskeletal: Moderate degenerative disc disease at L4-5 and L5-S1 levels. IMPRESSION: 1. No acute findings in the abdomen/pelvis. 2. Mild bilateral nephrolithiasis. 2.7 cm right renal hypodensity likely a cyst. Recommend renal ultrasound on elective basis for further evaluation. 3. Possible small sliding hiatal hernia. 4. Small umbilical and left inguinal hernias containing only peritoneal fat. 5. Aortic atherosclerosis. Aortic Atherosclerosis (ICD10-I70.0). Electronically Signed   By: Toribio Agreste M.D.   On: 02/26/2024 11:02     Procedures   Medications Ordered in the ED  ondansetron  (ZOFRAN ) injection 4 mg (has no administration in time range)  fentaNYL (SUBLIMAZE) injection 50 mcg (0 mcg Intravenous Hold 02/26/24 0835)  lactated ringers bolus 1,000 mL (0 mLs Intravenous Stopped 02/26/24 0949)  iohexol (OMNIPAQUE) 350 MG/ML injection 75 mL (75 mLs Intravenous Contrast Given 02/26/24 1028)  famotidine (PEPCID) IVPB 20 mg premix (0 mg Intravenous Stopped 02/26/24 1249)  alum & mag hydroxide-simeth (MAALOX/MYLANTA) 200-200-20 MG/5ML suspension 30 mL (30 mLs Oral Given 02/26/24 1210)    And  lidocaine (XYLOCAINE) 2 % viscous mouth solution 15 mL (15 mLs Oral Given 02/26/24 1210)  iohexol (OMNIPAQUE) 350 MG/ML  injection 75 mL (75 mLs Intravenous Contrast Given 02/26/24 1329)                                    Medical Decision Making Amount and/or Complexity of Data Reviewed Labs: ordered. Radiology: ordered.  Risk OTC drugs. Prescription drug management.    68 year old female with medical history significant for anxiety, depression, bipolar disorder, prior cholecystectomy who presents emergency department with epigastric and left upper quadrant abdominal pain.  The pain woke her up from sleep.  It is eased off somewhat after a 50 mcg of fentanyl and route with EMS.  She denies any nausea, states that she has been burping frequently, denies any vomiting.  She denies any diarrhea.  Pain is sharp and stabbing.  No radiation to the back.  Her last bowel movement was yesterday and was normal however she is not passing gas today.  She denies any alcohol use.  Denies any history of problems with her pancreas.  On arrival, the patient was vitally stable, afebrile, not tachycardic or tachypneic, not hypertensive,  saturating well on room air.  Presenting with stabbing epigastric and left upper quadrant abdominal pain that woke her up from sleep earlier today.  Not currently passing gas.  History of cholecystectomy.  On exam the patient had left upper quadrant and epigastric tenderness to palpation.  Differential diagnose includes pancreatitis, gastritis/peptic ulcer disease, GERD, hiatal hernia, bowel obstruction, diverticulitis, less likely choledocholithiasis, less likely ACS, with patient without shortness of breath or cough, low concern for PE.   IV access was obtained and the patient was administered IV fluid bolus and IV fentanyl for volume resuscitation and pain control.  Patient denied nausea, Zofran  ordered as needed.  Labs: CBC without a leukocytosis or anemia. Lipase normal, CMP unremarkable.  CT Abd Pel:  IMPRESSION:  1. No acute findings in the abdomen/pelvis.  2. Mild bilateral  nephrolithiasis. 2.7 cm right renal hypodensity  likely a cyst. Recommend renal ultrasound on elective basis for  further evaluation.  3. Possible small sliding hiatal hernia.  4. Small umbilical and left inguinal hernias containing only  peritoneal fat.  5. Aortic atherosclerosis.    Aortic Atherosclerosis (ICD10-I70.0).    Labs: Dimer 1.37, troponins normal x2.   CTA PE: IMPRESSION:  1. No acute cardiopulmonary disease and no evidence of pulmonary  embolism.  2. Aortic atherosclerosis. Atherosclerotic coronary artery disease.  3. Partially visualized hypodensity over the upper pole right kidney  unchanged from previous exam and likely a cyst. Recommend renal  ultrasound on an elective basis for further characterization.    Aortic Atherosclerosis (ICD10-I70.0).    The patient was feeling symptomatically improved on repeat assessment, tolerating p.o. intake, overall reassuring workup at this time.  Suspect component of mild gastritis versus peptic ulcer disease versus pain associated with hiatal hernia as etiology of the patient's presentation.  Advised Pepcid and Protonix, plan for outpatient PCP follow-up, return precautions provided.  Incidental findings noted in the patient's discharge summary.     Final diagnoses:  Hiatal hernia  Acute gastritis, presence of bleeding unspecified, unspecified gastritis type  Renal cyst  Kidney stones  Aortic atherosclerosis  Umbilical hernia without obstruction and without gangrene  Left upper quadrant abdominal pain    ED Discharge Orders          Ordered    famotidine (PEPCID) 20 MG tablet  2 times daily        02/26/24 1402    pantoprazole (PROTONIX) 20 MG tablet  Daily        02/26/24 1402               Jerrol Agent, MD 02/26/24 1402

## 2024-02-26 NOTE — ED Notes (Signed)
 Patient transported to CT

## 2024-02-26 NOTE — Discharge Instructions (Addendum)
 Your CT imaging revealed the following.  CT of the chest was negative for acute cardiopulmonary disease with no evidence of blood clot. IMPRESSION:  1. No acute findings in the abdomen/pelvis.  2. Mild bilateral nephrolithiasis. 2.7 cm right renal hypodensity  likely a cyst. Recommend renal ultrasound on elective basis for  further evaluation.  3. Possible small sliding hiatal hernia.  4. Small umbilical and left inguinal hernias containing only  peritoneal fat.  5. Aortic atherosclerosis.    Aortic Atherosclerosis (ICD10-I70.0).

## 2024-02-26 NOTE — ED Triage Notes (Signed)
 Per [facility] pt coming from home c/o epigastric pain that woke her from her sleep. Given 50 mcg fentanyl en route. 18G LAC. Denies any N/V/D or any urinary symptoms. States feels similar to gallbladder attack but had that removed in the 90s.

## 2024-02-26 NOTE — ED Notes (Signed)
Pt able to tolerate graham crackers and juice.
